# Patient Record
Sex: Male | Born: 1985 | Race: Black or African American | Hispanic: No | Marital: Single | Smoking: Current some day smoker
Health system: Southern US, Community
[De-identification: ages and names within clinical notes are randomized; demographics above are authoritative.]

## PROBLEM LIST (undated history)

## (undated) DIAGNOSIS — I10 Essential (primary) hypertension: Secondary | ICD-10-CM

## (undated) DIAGNOSIS — Z9889 Other specified postprocedural states: Secondary | ICD-10-CM

---

## 2007-08-07 ENCOUNTER — Emergency Department (HOSPITAL_COMMUNITY): Admission: EM | Admit: 2007-08-07 | Discharge: 2007-08-07 | Payer: Self-pay | Admitting: Emergency Medicine

## 2007-11-28 ENCOUNTER — Emergency Department (HOSPITAL_COMMUNITY): Admission: EM | Admit: 2007-11-28 | Discharge: 2007-11-28 | Payer: Self-pay | Admitting: Emergency Medicine

## 2008-02-11 ENCOUNTER — Emergency Department (HOSPITAL_COMMUNITY): Admission: EM | Admit: 2008-02-11 | Discharge: 2008-02-11 | Payer: Self-pay | Admitting: Emergency Medicine

## 2008-05-04 ENCOUNTER — Emergency Department (HOSPITAL_COMMUNITY): Admission: EM | Admit: 2008-05-04 | Discharge: 2008-05-04 | Payer: Self-pay | Admitting: Family Medicine

## 2008-08-13 ENCOUNTER — Emergency Department (HOSPITAL_COMMUNITY): Admission: EM | Admit: 2008-08-13 | Discharge: 2008-08-13 | Payer: Self-pay | Admitting: Family Medicine

## 2008-08-30 ENCOUNTER — Emergency Department (HOSPITAL_COMMUNITY): Admission: EM | Admit: 2008-08-30 | Discharge: 2008-08-30 | Payer: Self-pay | Admitting: Family Medicine

## 2008-12-26 ENCOUNTER — Emergency Department (HOSPITAL_COMMUNITY): Admission: EM | Admit: 2008-12-26 | Discharge: 2008-12-26 | Payer: Self-pay | Admitting: Family Medicine

## 2009-01-20 ENCOUNTER — Emergency Department (HOSPITAL_COMMUNITY): Admission: EM | Admit: 2009-01-20 | Discharge: 2009-01-20 | Payer: Self-pay | Admitting: Emergency Medicine

## 2009-02-23 ENCOUNTER — Emergency Department (HOSPITAL_COMMUNITY): Admission: EM | Admit: 2009-02-23 | Discharge: 2009-02-23 | Payer: Self-pay | Admitting: Emergency Medicine

## 2009-03-07 ENCOUNTER — Emergency Department (HOSPITAL_COMMUNITY): Admission: EM | Admit: 2009-03-07 | Discharge: 2009-03-07 | Payer: Self-pay | Admitting: Family Medicine

## 2009-03-10 ENCOUNTER — Emergency Department (HOSPITAL_COMMUNITY): Admission: EM | Admit: 2009-03-10 | Discharge: 2009-03-10 | Payer: Self-pay | Admitting: Family Medicine

## 2009-03-15 ENCOUNTER — Emergency Department (HOSPITAL_COMMUNITY): Admission: EM | Admit: 2009-03-15 | Discharge: 2009-03-15 | Payer: Self-pay | Admitting: Family Medicine

## 2009-03-26 ENCOUNTER — Emergency Department (HOSPITAL_COMMUNITY): Admission: EM | Admit: 2009-03-26 | Discharge: 2009-03-26 | Payer: Self-pay | Admitting: Family Medicine

## 2009-04-01 ENCOUNTER — Emergency Department (HOSPITAL_COMMUNITY): Admission: EM | Admit: 2009-04-01 | Discharge: 2009-04-01 | Payer: Self-pay | Admitting: Family Medicine

## 2009-04-09 ENCOUNTER — Emergency Department (HOSPITAL_COMMUNITY): Admission: EM | Admit: 2009-04-09 | Discharge: 2009-04-09 | Payer: Self-pay | Admitting: Family Medicine

## 2009-04-25 ENCOUNTER — Emergency Department (HOSPITAL_COMMUNITY): Admission: EM | Admit: 2009-04-25 | Discharge: 2009-04-25 | Payer: Self-pay | Admitting: Emergency Medicine

## 2009-05-02 ENCOUNTER — Emergency Department (HOSPITAL_COMMUNITY): Admission: EM | Admit: 2009-05-02 | Discharge: 2009-05-02 | Payer: Self-pay | Admitting: Family Medicine

## 2009-05-08 ENCOUNTER — Emergency Department (HOSPITAL_COMMUNITY): Admission: EM | Admit: 2009-05-08 | Discharge: 2009-05-08 | Payer: Self-pay | Admitting: Family Medicine

## 2010-03-05 ENCOUNTER — Emergency Department (HOSPITAL_COMMUNITY)
Admission: EM | Admit: 2010-03-05 | Discharge: 2010-03-05 | Payer: Self-pay | Source: Home / Self Care | Admitting: Emergency Medicine

## 2010-05-26 ENCOUNTER — Emergency Department (HOSPITAL_COMMUNITY)
Admission: EM | Admit: 2010-05-26 | Discharge: 2010-05-26 | Payer: Self-pay | Source: Home / Self Care | Admitting: Family Medicine

## 2010-05-29 LAB — HIV ANTIBODY (ROUTINE TESTING W REFLEX): HIV: NONREACTIVE

## 2010-05-29 LAB — SYPHILIS: RPR W/REFLEX TO RPR TITER AND TREPONEMAL ANTIBODIES, TRADITIONAL SCREENING AND DIAGNOSIS ALGORITHM: RPR Ser Ql: NONREACTIVE

## 2010-07-18 LAB — POCT URINALYSIS DIPSTICK
Nitrite: NEGATIVE
Protein, ur: 30 mg/dL — AB
Urobilinogen, UA: 4 mg/dL — ABNORMAL HIGH (ref 0.0–1.0)
pH: 6 (ref 5.0–8.0)

## 2010-07-18 LAB — GC/CHLAMYDIA PROBE AMP, GENITAL: GC Probe Amp, Genital: NEGATIVE

## 2010-08-08 LAB — POCT URINALYSIS DIP (DEVICE)
Protein, ur: NEGATIVE mg/dL
Specific Gravity, Urine: 1.015 (ref 1.005–1.030)
Urobilinogen, UA: 1 mg/dL (ref 0.0–1.0)

## 2010-08-08 LAB — GC/CHLAMYDIA PROBE AMP, GENITAL: GC Probe Amp, Genital: NEGATIVE

## 2010-08-10 ENCOUNTER — Inpatient Hospital Stay (HOSPITAL_COMMUNITY)
Admission: RE | Admit: 2010-08-10 | Discharge: 2010-08-10 | Disposition: A | Discharge status: Home / Self Care | Payer: Self-pay | Source: Ambulatory Visit | Attending: Family Medicine | Admitting: Family Medicine

## 2010-08-10 LAB — RPR: RPR Ser Ql: NONREACTIVE

## 2010-08-11 LAB — HIV ANTIBODY (ROUTINE TESTING W REFLEX): HIV: NONREACTIVE

## 2010-08-13 LAB — GC/CHLAMYDIA PROBE AMP, GENITAL
Chlamydia, DNA Probe: NEGATIVE
GC Probe Amp, Genital: NEGATIVE

## 2010-08-15 LAB — GC/CHLAMYDIA PROBE AMP, GENITAL: GC Probe Amp, Genital: NEGATIVE

## 2011-01-23 ENCOUNTER — Inpatient Hospital Stay (INDEPENDENT_AMBULATORY_CARE_PROVIDER_SITE_OTHER)
Admission: RE | Admit: 2011-01-23 | Discharge: 2011-01-23 | Disposition: A | Discharge status: Home / Self Care | Payer: PRIVATE HEALTH INSURANCE | Source: Ambulatory Visit | Attending: Family Medicine | Admitting: Family Medicine

## 2011-01-23 DIAGNOSIS — L259 Unspecified contact dermatitis, unspecified cause: Secondary | ICD-10-CM

## 2011-01-23 LAB — HIV ANTIBODY (ROUTINE TESTING W REFLEX): HIV: NONREACTIVE

## 2011-02-01 LAB — POCT URINALYSIS DIP (DEVICE)
Ketones, ur: 15 — AB
Operator id: 282151
Protein, ur: >=300 — AB
Specific Gravity, Urine: 1.025

## 2011-02-08 LAB — POCT URINALYSIS DIP (DEVICE)
Bilirubin Urine: NEGATIVE
Glucose, UA: NEGATIVE mg/dL
Protein, ur: 30 mg/dL — AB
Specific Gravity, Urine: 1.025 (ref 1.005–1.030)

## 2011-02-08 LAB — HIV ANTIBODY (ROUTINE TESTING W REFLEX): HIV: NONREACTIVE

## 2011-05-22 ENCOUNTER — Encounter (HOSPITAL_COMMUNITY): Payer: Self-pay | Admitting: Emergency Medicine

## 2011-05-22 ENCOUNTER — Emergency Department (HOSPITAL_COMMUNITY)
Admission: EM | Admit: 2011-05-22 | Discharge: 2011-05-22 | Disposition: A | Discharge status: Home / Self Care | Payer: Self-pay | Attending: Emergency Medicine | Admitting: Emergency Medicine

## 2011-05-22 DIAGNOSIS — J029 Acute pharyngitis, unspecified: Secondary | ICD-10-CM | POA: Insufficient documentation

## 2011-05-22 MED ORDER — AMOXICILLIN 500 MG PO TABS: 1000.0000 mg | ORAL_TABLET | Freq: Two times a day (BID) | ORAL | Status: AC

## 2011-05-22 NOTE — ED Notes (Signed)
Patient states has had sore throat headache, and  Body aches, no nausea, vomiting or diarrhea

## 2011-05-22 NOTE — ED Provider Notes (Signed)
History     CSN: [id number]  Arrival date & time 05/22/11  1537   First MD Initiated Contact with Patient 05/22/11 1631      Chief Complaint  Patient presents with  . Sore Throat    (Consider location/radiation/quality/duration/timing/severity/associated sxs/prior treatment) HPI Jeffrey Johnson is a 26 y.o. male presents with c/o sore throat leading to desire to be assessed in the ED. The sx(s) have been present for 2 days. Additional concerns are headache and stomach pain. Causative factors are not known. Palliative factors are nothing. The distress associated is mild. The disorder has been present for 2 days. History reviewed. No pertinent past medical history.  History reviewed. No pertinent past surgical history.  Family History  Problem Relation Age of Onset  . Diabetes Other   . Diabetes Other     History  Substance Use Topics  . Smoking status: Never Smoker   . Smokeless tobacco: Never Used  . Alcohol Use: No     occausionally      Review of Systems  All other systems reviewed and are negative.    Allergies  Review of patient's allergies indicates no known allergies.  Home Medications   Current Outpatient Rx  Name Route Sig Dispense Refill  . IBUPROFEN 200 MG PO TABS Oral Take 200 mg by mouth every 6 (six) hours as needed. For pain.    Marland Kitchen AMOXICILLIN 500 MG PO TABS Oral Take 2 tablets (1,000 mg total) by mouth 2 (two) times daily. 28 tablet 0    BP 145/76  Pulse 84  Temp(Src) 99.5 F (37.5 C) (Oral)  Resp 16  SpO2 99%  Physical Exam  Nursing note and vitals reviewed. Constitutional: He is oriented to person, place, and time. He appears well-developed and well-nourished.  HENT:  Head: Normocephalic and atraumatic.  Right Ear: External ear normal.  Left Ear: External ear normal.  Mouth/Throat: No oropharyngeal exudate.       Tonsils red and slightly swollen  Eyes: Conjunctivae and EOM are normal. Pupils are equal, round, and reactive to  light.  Neck: Normal range of motion and phonation normal. Neck supple.  Cardiovascular: Normal rate, regular rhythm, normal heart sounds and intact distal pulses.   Pulmonary/Chest: Effort normal and breath sounds normal. He exhibits no bony tenderness.  Abdominal: Soft. Normal appearance. There is no tenderness.  Musculoskeletal: Normal range of motion.  Neurological: He is alert and oriented to person, place, and time. He has normal strength. No cranial nerve deficit or sensory deficit. He exhibits normal muscle tone. Coordination normal.  Skin: Skin is warm, dry and intact.  Psychiatric: He has a normal mood and affect. His behavior is normal. Judgment and thought content normal.    ED Course  Procedures (including critical care time)  Labs Reviewed - No data to display No results found.   1. Pharyngitis       MDM  Clinical pharyngitis, possible strep, pt prefers empiric treatment.        Flint Melter, MD 05/22/11 [phone]

## 2011-05-24 NOTE — ED Notes (Signed)
Pt. came in for his lab results. Pt. verified with picture ID and shown neg. GC/Chlamydia and non- reactive HIV and RPR. Pt. instructed to get HIV rechecked in 2 mos. ( since he had it 4 mos. ago.) He asked where he can check to see if he is able to get his wife pregnant. I told him, they would need to go to her OB-GYN. If they don't specialize in infertility then they could refer her to a fertility specialist. They would instruct him where to got to get sperm count checked. Vassie Moselle 05/24/2011

## 2011-08-05 ENCOUNTER — Emergency Department (HOSPITAL_COMMUNITY)
Admission: EM | Admit: 2011-08-05 | Discharge: 2011-08-05 | Disposition: A | Discharge status: Home / Self Care | Payer: Self-pay | Source: Home / Self Care | Attending: Emergency Medicine | Admitting: Emergency Medicine

## 2011-08-05 ENCOUNTER — Encounter (HOSPITAL_COMMUNITY): Payer: Self-pay | Admitting: *Deleted

## 2011-08-05 DIAGNOSIS — R040 Epistaxis: Secondary | ICD-10-CM

## 2011-08-05 DIAGNOSIS — R030 Elevated blood-pressure reading, without diagnosis of hypertension: Secondary | ICD-10-CM

## 2011-08-05 NOTE — Discharge Instructions (Signed)
As discussed start with normal saline spray rightmost or and to night apply a feeling of Vaseline with a Q-tip carefully. As discussed incidentally your blood pressure was noted to be elevated will need to have your blood pressure recheck, if elevated numbers are seen should return for recheck in referral to a primary care Dr. Gaylyn Lambert discharge instructions about nosebleeds    Arterial Hypertension Arterial hypertension (high blood pressure) is a condition of elevated pressure in your blood vessels. Hypertension over a long period of time is a risk factor for strokes, heart attacks, and heart failure. It is also the leading cause of kidney (renal) failure.  CAUSES   In Adults -- Over 90% of all hypertension has no known cause. This is called essential or primary hypertension. In the other 10% of people with hypertension, the increase in blood pressure is caused by another disorder. This is called secondary hypertension. Important causes of secondary hypertension are:   Heavy alcohol use.   Obstructive sleep apnea.   Hyperaldosterosim (Conn's syndrome).   Steroid use.   Chronic kidney failure.   Hyperparathyroidism.   Medications.   Renal artery stenosis.   Pheochromocytoma.   Cushing's disease.   Coarctation of the aorta.   Scleroderma renal crisis.   Licorice (in excessive amounts).   Drugs (cocaine, methamphetamine).  Your caregiver can explain any items above that apply to you.  In Children -- Secondary hypertension is more common and should always be considered.   Pregnancy -- Few women of childbearing age have high blood pressure. However, up to 10% of them develop hypertension of pregnancy. Generally, this will not harm the woman. It may be a sign of 3 complications of pregnancy: preeclampsia, HELLP syndrome, and eclampsia. Follow up and control with medication is necessary.  SYMPTOMS   This condition normally does not produce any noticeable symptoms. It is usually  found during a routine exam.   Malignant hypertension is a late problem of high blood pressure. It may have the following symptoms:   Headaches.   Blurred vision.   End-organ damage (this means your kidneys, heart, lungs, and other organs are being damaged).   Stressful situations can increase the blood pressure. If a person with normal blood pressure has their blood pressure go up while being seen by their caregiver, this is often termed "white coat hypertension." Its importance is not known. It may be related with eventually developing hypertension or complications of hypertension.   Hypertension is often confused with mental tension, stress, and anxiety.  DIAGNOSIS  The diagnosis is made by 3 separate blood pressure measurements. They are taken at least 1 week apart from each other. If there is organ damage from hypertension, the diagnosis may be made without repeat measurements. Hypertension is usually identified by having blood pressure readings:  Above 140/90 mmHg measured in both arms, at 3 separate times, over a couple weeks.   Over 130/80 mmHg should be considered a risk factor and may require treatment in patients with diabetes.  Blood pressure readings over 120/80 mmHg are called "pre-hypertension" even in non-diabetic patients. To get a true blood pressure measurement, use the following guidelines. Be aware of the factors that can alter blood pressure readings.  Take measurements at least 1 hour after caffeine.   Take measurements 30 minutes after smoking and without any stress. This is another reason to quit smoking - it raises your blood pressure.   Use a proper cuff size. Ask your caregiver if you are not sure  about your cuff size.   Most home blood pressure cuffs are automatic. They will measure systolic and diastolic pressures. The systolic pressure is the pressure reading at the start of sounds. Diastolic pressure is the pressure at which the sounds disappear. If you  are elderly, measure pressures in multiple postures. Try sitting, lying or standing.   Sit at rest for a minimum of 5 minutes before taking measurements.   You should not be on any medications like decongestants. These are found in many cold medications.   Record your blood pressure readings and review them with your caregiver.  If you have hypertension:  Your caregiver may do tests to be sure you do not have secondary hypertension (see "causes" above).   Your caregiver may also look for signs of metabolic syndrome. This is also called Syndrome X or Insulin Resistance Syndrome. You may have this syndrome if you have type 2 diabetes, abdominal obesity, and abnormal blood lipids in addition to hypertension.   Your caregiver will take your medical and family history and perform a physical exam.   Diagnostic tests may include blood tests (for glucose, cholesterol, potassium, and kidney function), a urinalysis, or an EKG. Other tests may also be necessary depending on your condition.  PREVENTION  There are important lifestyle issues that you can adopt to reduce your chance of developing hypertension:  Maintain a normal weight.   Limit the amount of salt (sodium) in your diet.   Exercise often.   Limit alcohol intake.   Get enough potassium in your diet. Discuss specific advice with your caregiver.   Follow a DASH diet (dietary approaches to stop hypertension). This diet is rich in fruits, vegetables, and low-fat dairy products, and avoids certain fats.  PROGNOSIS  Essential hypertension cannot be cured. Lifestyle changes and medical treatment can lower blood pressure and reduce complications. The prognosis of secondary hypertension depends on the underlying cause. Many people whose hypertension is controlled with medicine or lifestyle changes can live a normal, healthy life.  RISKS AND COMPLICATIONS  While high blood pressure alone is not an illness, it often requires treatment due to its  short- and long-term effects on many organs. Hypertension increases your risk for:  CVAs or strokes (cerebrovascular accident).   Heart failure due to chronically high blood pressure (hypertensive cardiomyopathy).   Heart attack (myocardial infarction).   Damage to the retina (hypertensive retinopathy).   Kidney failure (hypertensive nephropathy).  Your caregiver can explain list items above that apply to you. Treatment of hypertension can significantly reduce the risk of complications. TREATMENT   For overweight patients, weight loss and regular exercise are recommended. Physical fitness lowers blood pressure.   Mild hypertension is usually treated with diet and exercise. A diet rich in fruits and vegetables, fat-free dairy products, and foods low in fat and salt (sodium) can help lower blood pressure. Decreasing salt intake decreases blood pressure in a 1/3 of people.   Stop smoking if you are a smoker.  The steps above are highly effective in reducing blood pressure. While these actions are easy to suggest, they are difficult to achieve. Most patients with moderate or severe hypertension end up requiring medications to bring their blood pressure down to a normal level. There are several classes of medications for treatment. Blood pressure pills (antihypertensives) will lower blood pressure by their different actions. Lowering the blood pressure by 10 mmHg may decrease the risk of complications by as much as 25%. The goal of treatment is effective blood  pressure control. This will reduce your risk for complications. Your caregiver will help you determine the best treatment for you according to your lifestyle. What is excellent treatment for one person, may not be for you. HOME CARE INSTRUCTIONS   Do not smoke.   Follow the lifestyle changes outlined in the "Prevention" section.   If you are on medications, follow the directions carefully. Blood pressure medications must be taken as  prescribed. Skipping doses reduces their benefit. It also puts you at risk for problems.   Follow up with your caregiver, as directed.   If you are asked to monitor your blood pressure at home, follow the guidelines in the "Diagnosis" section above.  SEEK MEDICAL CARE IF:   You think you are having medication side effects.   You have recurrent headaches or lightheadedness.   You have swelling in your ankles.   You have trouble with your vision.  SEEK IMMEDIATE MEDICAL CARE IF:   You have sudden onset of chest pain or pressure, difficulty breathing, or other symptoms of a heart attack.   You have a severe headache.   You have symptoms of a stroke (such as sudden weakness, difficulty speaking, difficulty walking).  MAKE SURE YOU:   Understand these instructions.   Will watch your condition.   Will get help right away if you are not doing well or get worse.  Document Released: 04/22/2005 Document Revised: 04/11/2011 Document Reviewed: 11/20/2006 [facility] Patient Information 2012 [place], [place].

## 2011-08-05 NOTE — ED Provider Notes (Signed)
History     CSN: [id number]  Arrival date & time 08/05/11  1842   First MD Initiated Contact with Patient 08/05/11 1900      Chief Complaint  Patient presents with  . Epistaxis    (Consider location/radiation/quality/duration/timing/severity/associated sxs/prior treatment) HPI Comments: Have noticed since Saturday my notes I think helping try and Saturday first episode in which the right side of my nose bled but it stopped after a pinch my nose.. Some they have bled again and blue clot I had in my nose bled even for more after that pinch my nose again and stopped. Today around 5 to 6:00 started bleeding again and put a towel my nose to stop the bleeding. Have been having a bit of a runny and congested nose with some throat irritation but no fevers and no other symptoms. NoI have no other symptoms and I have no knowledge about her blood pressure being high.  Patient is a 26 y.o. male presenting with nosebleeds. The history is provided by the patient.  Epistaxis  This is a new problem. The current episode started 2 days ago. The problem occurs daily. The problem has been resolved. The bleeding has been from the right nare. He has tried nothing for the symptoms. The treatment provided significant relief. His past medical history does not include bleeding disorder, colds, sinus problems, allergies or nose-picking.    History reviewed. No pertinent past medical history.  History reviewed. No pertinent past surgical history.  Family History  Problem Relation Age of Onset  . Diabetes Other   . Diabetes Other     History  Substance Use Topics  . Smoking status: Never Smoker   . Smokeless tobacco: Never Used  . Alcohol Use: Yes     occausionally      Review of Systems  Constitutional: Negative for fever, appetite change and fatigue.  HENT: Positive for nosebleeds, congestion and rhinorrhea. Negative for neck pain.   Eyes: Negative for pain.  Respiratory: Negative for cough and  shortness of breath.   Skin: Negative for color change, pallor and rash.  Psychiatric/Behavioral: The patient is nervous/anxious.     Allergies  Review of patient's allergies indicates no known allergies.  Home Medications   Current Outpatient Rx  Name Route Sig Dispense Refill  . IBUPROFEN 200 MG PO TABS Oral Take 200 mg by mouth every 6 (six) hours as needed. For pain.      BP 161/101  Pulse 66  Temp(Src) 99.2 F (37.3 C) (Oral)  Resp 16  SpO2 100%  Physical Exam  Nursing note and vitals reviewed. Constitutional: He appears well-developed and well-nourished. No distress.  HENT:  Head: Normocephalic.  Nose: Mucosal edema present. No sinus tenderness or septal deviation. Epistaxis is observed.  No foreign bodies.  Eyes: Conjunctivae are normal.  Neck: Neck supple.  Cardiovascular: Normal rate, regular rhythm, S1 normal, normal heart sounds and normal pulses.   Pulmonary/Chest: Effort normal. He has decreased breath sounds.  Abdominal: He exhibits no distension.  Skin: Skin is warm. No erythema.    ED Course  Procedures (including critical care time)  Labs Reviewed - No data to display No results found.   1. Right-sided epistaxis   2. Elevated blood pressure       MDM  Right nostril epistaxis recurrent for 3 days. Resolve during exam no active bleeding noted noted patient to be hypertensive patient denies any history of high blood pressures does admit that he is a patches and nerves  will recheck his blood pressure in the near future will return for recheck.        Jimmie Molly, MD 08/05/11 2021

## 2011-08-05 NOTE — ED Notes (Signed)
pT  REPORTS  HE  NOTICED  A  NOSEBLEED EARLIER  OVER THE  WEEKEND  HE  REPORTS  HE  AWOKE  AFTER A  SLEEP  AND  NOTICED  SOME BLOOD REOM R  NARE  -  NO  ACTIVE  BLEEDING AT THIS  TIME  - HE  DOES  REPORT  RECENT   SORETHROAT      AND  SOME  NASAL CONGESTION

## 2011-08-08 NOTE — ED Notes (Signed)
Pt. came to [facility] requesting a work note. States he returned to work today. Discussed with Dr. Ladon Applebaum and he said that was fine.  Do the work note to return today 4/4. Note done as directed and given to the patient. Jeffrey Johnson 08/08/2011

## 2013-05-23 ENCOUNTER — Emergency Department (INDEPENDENT_AMBULATORY_CARE_PROVIDER_SITE_OTHER)
Admission: EM | Admit: 2013-05-23 | Discharge: 2013-05-23 | Disposition: A | Discharge status: Home / Self Care | Payer: Self-pay | Source: Home / Self Care | Attending: Emergency Medicine | Admitting: Emergency Medicine

## 2013-05-23 ENCOUNTER — Encounter (HOSPITAL_COMMUNITY): Payer: Self-pay | Admitting: Emergency Medicine

## 2013-05-23 DIAGNOSIS — K029 Dental caries, unspecified: Secondary | ICD-10-CM

## 2013-05-23 MED ORDER — NAPROXEN 375 MG PO TABS: 375.0000 mg | ORAL_TABLET | Freq: Two times a day (BID) | ORAL | Status: DC

## 2013-05-23 MED ORDER — LIDOCAINE VISCOUS 2 % MT SOLN
OROMUCOSAL | Status: DC
Start: 2013-05-23 — End: 2019-04-19

## 2013-05-23 NOTE — ED Provider Notes (Signed)
Medical screening examination/treatment/procedure(s) were performed by non-physician practitioner and as supervising physician I was immediately available for consultation/collaboration.  Leslee Homeavid Kwane Rohl, M.D.  Reuben Likesavid C Kunta Hilleary, MD 05/23/13 2129

## 2013-05-23 NOTE — ED Provider Notes (Signed)
CSN: [id number]     Arrival date & time 05/23/13  [id number] History   First MD Initiated Contact with Patient 05/23/13 1048     Chief Complaint  Patient presents with  . Otalgia   (Consider location/radiation/quality/duration/timing/severity/associated sxs/prior Treatment) HPI Comments: Patient reports that about 3 days ago he was eating a meal and felt his filling come out of a left upper molar. Since that time he has had sensitivity at that tooth with some associated left ear pain. Some relief with ibuprofen. No fever, facial swelling, drainage from ear or hearing loss. No sore throat.   Patient is a 28 y.o. male presenting with ear pain. The history is provided by the patient.  Otalgia Associated symptoms: no neck pain     History reviewed. No pertinent past medical history. History reviewed. No pertinent past surgical history. Family History  Problem Relation Age of Onset  . Diabetes Other   . Diabetes Other    History  Substance Use Topics  . Smoking status: Never Smoker   . Smokeless tobacco: Never Used  . Alcohol Use: Yes     Comment: occausionally    Review of Systems  Constitutional: Negative.   HENT: Positive for dental problem and ear pain.   Eyes: Negative.   Cardiovascular: Negative.   Gastrointestinal: Negative.   Musculoskeletal: Negative for neck pain and neck stiffness.  Skin: Negative.   Hematological: Negative for adenopathy.    Allergies  Review of patient's allergies indicates no known allergies.  Home Medications   Current Outpatient Rx  Name  Route  Sig  Dispense  Refill  . ibuprofen (ADVIL,MOTRIN) 200 MG tablet   Oral   Take 200 mg by mouth every 6 (six) hours as needed. For pain.          BP 145/92  Pulse 52  Temp(Src) 97.4 F (36.3 C) (Oral)  Resp 17  SpO2 100% Physical Exam  Nursing note and vitals reviewed. Constitutional: He is oriented to person, place, and time. He appears well-developed and well-nourished. No distress.  HENT:    Head: Normocephalic and atraumatic.  Right Ear: Hearing, tympanic membrane, external ear and ear canal normal. No mastoid tenderness.  Left Ear: Hearing, tympanic membrane, external ear and ear canal normal. No mastoid tenderness.  Nose: Nose normal.  Mouth/Throat: Uvula is midline, oropharynx is clear and moist and mucous membranes are normal.    Eyes: Conjunctivae are normal. Right eye exhibits no discharge. Left eye exhibits no discharge. No scleral icterus.  Neck: Normal range of motion. Neck supple.  Cardiovascular: Normal rate.   Pulmonary/Chest: Effort normal.  Musculoskeletal: Normal range of motion.  Lymphadenopathy:    He has no cervical adenopathy.  Neurological: He is alert and oriented to person, place, and time.  Skin: Skin is warm and dry.  Psychiatric: He has a normal mood and affect. His behavior is normal.    ED Course  Procedures (including critical care time) Labs Review Labs Reviewed - No data to display Imaging Review No results found.  EKG Interpretation    Date/Time:    Ventricular Rate:    PR Interval:    QRS Duration:   QT Interval:    QTC Calculation:   R Axis:     Text Interpretation:              MDM  Left ear and eardrum normal. Likely experiencing referred pain from dental issue. Will assist with pain management and patient reports that  will contact his  dentist on Monday for evaluation.     Jess Barters [place], [place] 05/23/13 [phone]

## 2013-05-23 NOTE — ED Notes (Signed)
Pt     Reports  l  Earache      X  3  Days             No  Other  Symptoms  Except  For   Hole in a  Tooth  On that  Side   -  No  Facial  Swelling

## 2013-05-23 NOTE — Discharge Instructions (Signed)
Dental Caries  °Dental caries (also called tooth decay) is the most common oral disease. It can occur at any age, but is more common in children and young adults.  °HOW DENTAL CARIES DEVELOPS  °The process of decay begins when bacteria and foods (particularly sugars and starches) combine in your mouth to produce plaque. Plaque is a substance that sticks to the hard, outer surface of a tooth (enamel). The bacteria in plaque produce acids that attack enamel. These acids may also attack the root surface of a tooth (cementum) if it is exposed. Repeated attacks dissolve these surfaces and create holes in the tooth (cavities). If left untreated, the acids destroy the other layers of the tooth.  °RISK FACTORS °· Frequent sipping of sugary beverages.   °· Frequent snacking on sugary and starchy foods, especially those that easily get stuck in the teeth.   °· Poor oral hygiene.   °· Dry mouth.   °· Substance abuse such as methamphetamine abuse.   °· Broken or poor-fitting dental restorations.   °· Eating disorders.   °· Gastroesophageal reflux disease (GERD).   °· Certain radiation treatments to the head and neck. °SYMPTOMS °In the early stages of dental caries, symptoms are seldom present. Sometimes white, chalky areas may be seen on the enamel or other tooth layers. In later stages, symptoms may include: °· Pits and holes on the enamel. °· Toothache after sweet, hot, or cold foods or drinks are consumed. °· Pain around the tooth. °· Swelling around the tooth. °DIAGNOSIS  °Most of the time, dental caries is detected during a regular dental checkup. A diagnosis is made after a thorough medical and dental history is taken and the surfaces of your teeth are checked for signs of dental caries. Sometimes special instruments, such as lasers, are used to check for dental caries. Dental X-ray exams may be taken so that areas not visible to the eye (such as between the contact areas of the teeth) can be checked for cavities.    °TREATMENT  °If dental caries is in its early stages, it may be reversed with a fluoride treatment or an application of a remineralizing agent at the dental office. Thorough brushing and flossing at home is needed to aid these treatments. If it is in its later stages, treatment depends on the location and extent of tooth destruction:  °· If a small area of the tooth has been destroyed, the destroyed area will be removed and cavities will be filled with a material such as gold, silver amalgam, or composite resin.   °· If a large area of the tooth has been destroyed, the destroyed area will be removed and a cap (crown) will be fitted over the remaining tooth structure.   °· If the center part of the tooth (pulp) is affected, a procedure called a root canal will be needed before a filling or crown can be placed.   °· If most of the tooth has been destroyed, the tooth may need to be pulled (extracted). °HOME CARE INSTRUCTIONS °You can prevent, stop, or reverse dental caries at home by practicing good oral hygiene. Good oral hygiene includes: °· Thoroughly cleaning your teeth at least twice a day with a toothbrush and dental floss.   °· Using a fluoride toothpaste. A fluoride mouth rinse may also be used if recommended by your dentist or health care provider.   °· Restricting the amount of sugary and starchy foods and sugary liquids you consume.   °· Avoiding frequent snacking on these foods and sipping of these liquids.   °· Keeping regular visits   with a dentist for checkups and cleanings. °PREVENTION  °· Practice good oral hygiene. °· Consider a dental sealant. A dental sealant is a coating material that is applied by your dentist to the pits and grooves of teeth. The sealant prevents food from being trapped in them. It may protect the teeth for several years. °· Ask about fluoride supplements if you live in a community without fluorinated water or with water that has a low fluoride content. Use fluoride supplements  as directed by your dentist or health care provider. °· Allow fluoride varnish applications to teeth if directed by your dentist or health care provider. °Document Released: 01/12/2002 Document Revised: 12/23/2012 Document Reviewed: 04/24/2012 °ExitCare® Patient Information ©2014 [employer]. ° °Dental Pain °A tooth ache may be caused by cavities (tooth decay). Cavities expose the nerve of the tooth to air and hot or cold temperatures. It may come from an infection or abscess (also called a boil or furuncle) around your tooth. It is also often caused by dental caries (tooth decay). This causes the pain you are having. °DIAGNOSIS  °Your caregiver can diagnose this problem by exam. °TREATMENT  °· If caused by an infection, it may be treated with medications which kill germs (antibiotics) and pain medications as prescribed by your caregiver. Take medications as directed. °· Only take over-the-counter or prescription medicines for pain, discomfort, or fever as directed by your caregiver. °· Whether the tooth ache today is caused by infection or dental disease, you should see your dentist as soon as possible for further care. °SEEK MEDICAL CARE IF: °The exam and treatment you received today has been provided on an emergency basis only. This is not a substitute for complete medical or dental care. If your problem worsens or new problems (symptoms) appear, and you are unable to meet with your dentist, call or return to this location. °SEEK IMMEDIATE MEDICAL CARE IF:  °· You have a fever. °· You develop redness and swelling of your face, jaw, or neck. °· You are unable to open your mouth. °· You have severe pain uncontrolled by pain medicine. °MAKE SURE YOU:  °· Understand these instructions. °· Will watch your condition. °· Will get help right away if you are not doing well or get worse. °Document Released: 04/22/2005 Document Revised: 07/15/2011 Document Reviewed: 12/09/2007 °ExitCare® Patient Information ©2014  [employer]. ° °

## 2019-03-21 ENCOUNTER — Other Ambulatory Visit: Payer: Self-pay

## 2019-03-21 ENCOUNTER — Encounter (HOSPITAL_COMMUNITY): Payer: Self-pay | Admitting: Emergency Medicine

## 2019-03-21 ENCOUNTER — Emergency Department (HOSPITAL_COMMUNITY): Payer: Self-pay

## 2019-03-21 ENCOUNTER — Emergency Department (HOSPITAL_COMMUNITY)
Admission: EM | Admit: 2019-03-21 | Discharge: 2019-03-21 | Disposition: A | Discharge status: Home / Self Care | Payer: Self-pay | Attending: Emergency Medicine | Admitting: Emergency Medicine

## 2019-03-21 DIAGNOSIS — Z79899 Other long term (current) drug therapy: Secondary | ICD-10-CM | POA: Insufficient documentation

## 2019-03-21 DIAGNOSIS — R079 Chest pain, unspecified: Secondary | ICD-10-CM

## 2019-03-21 DIAGNOSIS — I10 Essential (primary) hypertension: Secondary | ICD-10-CM

## 2019-03-21 LAB — BASIC METABOLIC PANEL
Anion gap: 8 (ref 5–15)
BUN: 13 mg/dL (ref 6–20)
CO2: 27 mmol/L (ref 22–32)
Calcium: 9.5 mg/dL (ref 8.9–10.3)
Chloride: 105 mmol/L (ref 98–111)
Creatinine, Ser: 1.02 mg/dL (ref 0.61–1.24)
GFR calc Af Amer: >60 mL/min (ref >60)
GFR calc non Af Amer: >60 mL/min (ref >60)
Glucose, Bld: 96 mg/dL (ref 70–99)
Potassium: 3.6 mmol/L (ref 3.5–5.1)
Sodium: 140 mmol/L (ref 135–145)

## 2019-03-21 LAB — CBC
HCT: 37.2 % — ABNORMAL LOW (ref 39.0–52.0)
Hemoglobin: 12.5 g/dL — ABNORMAL LOW (ref 13.0–17.0)
MCH: 29.8 pg (ref 26.0–34.0)
MCHC: 33.6 g/dL (ref 30.0–36.0)
MCV: 88.6 fL (ref 80.0–100.0)
Platelets: 239 10*3/uL (ref 150–400)
RBC: 4.2 MIL/uL — ABNORMAL LOW (ref 4.22–5.81)
RDW: 12.9 % (ref 11.5–15.5)
WBC: 6.2 10*3/uL (ref 4.0–10.5)
nRBC: 0 % (ref 0.0–0.2)

## 2019-03-21 LAB — LIPASE, BLOOD: Lipase: 26 U/L (ref 11–51)

## 2019-03-21 LAB — TROPONIN I (HIGH SENSITIVITY): Troponin I (High Sensitivity): 3 ng/L (ref <18)

## 2019-03-21 MED ORDER — METHOCARBAMOL 500 MG PO TABS: 500.0000 mg | ORAL_TABLET | Freq: Two times a day (BID) | ORAL | 0 refills | Status: DC

## 2019-03-21 MED ORDER — SODIUM CHLORIDE 0.9% FLUSH: 3.0000 mL | Freq: Once | INTRAVENOUS | Status: DC

## 2019-03-21 NOTE — ED Notes (Signed)
Patient brady down 48-49

## 2019-03-21 NOTE — Discharge Instructions (Addendum)
Use Tylenol and muscle relaxer for pain.  Decreased dose of ibuprofen and frequency of use as I am concerned there is a possibility that he may have a ulcer causing your symptoms today.  Follow-up with your primary care provider if you do not have a primary care provider you may use the attached information for [place] and community wellness  Please follow-up with your primary care provider to have your blood pressure rechecked and for continued medication refill.

## 2019-03-21 NOTE — ED Triage Notes (Signed)
Patient here from home with complaints of left sided chest pain. Denies n/v.

## 2019-03-21 NOTE — ED Notes (Signed)
Urinal placed at bedside.

## 2019-03-21 NOTE — ED Provider Notes (Signed)
[facility]-EMERGENCY DEPT Provider Note   CSN: [id number] Arrival date & time: 03/21/19  1315     History   Chief Complaint Chief Complaint  Patient presents with  . Chest Pain    HPI Jeffrey Johnson is a 33 y.o. male history of hypertension on lisinopril  HPI: A 33 year old patient with a history of hypertension presents for evaluation of chest pain. Initial onset of pain was more than 6 hours ago. The patient's chest pain is well-localized, is sharp and is not worse with exertion. The patient's chest pain is middle- or left-sided, is not described as heaviness/pressure/tightness and does not radiate to the arms/jaw/neck. The patient does not complain of nausea and denies diaphoresis. The patient has no history of stroke, has no history of peripheral artery disease, has not smoked in the past 90 days, denies any history of treated diabetes, has no relevant family history of coronary artery disease (first degree relative at less than age 75), has no history of hypercholesterolemia and does not have an elevated BMI (>=30).   HPI  Patient presents today for 4 days of persistent, nagging, mild, sharp left-sided substernal chest pain that is similar to episodes he has had over the past 4 years patient states that these episodes usually last several days to a week.  Patient states that there are no aggravating or mitigating factors.   Unchanged by position or eating.  States he has taken some ibuprofen for this which does not help.  Patient states the pain is nonexertional, not associated with shortness of breath, no nausea, abdominal pain, diaphoresis.  Patient states that 4 years ago he was playing basketball got in the chest and was medically evaluated and had a heart catheterization due to abnormal EKG.  Patient states that that catheterization showed absolutely no problems or occlusion or atherosclerotic disease.  Was told that he was medically cleared.  Since that time  patient states he has been seen and had EKGs and blood work done multiple times for this pain with no further authorizations.  She states that he is never been a smoker, does not use cocaine or any other drugs, drinks occasional alcohol 1-2 drinks per week, no history of clots, no leg swelling, no recent surgery, denies long travel or immobilization.  History reviewed. No pertinent past medical history.  There are no active problems to display for this patient.   History reviewed. No pertinent surgical history.      Home Medications    Prior to Admission medications   Medication Sig Start Date End Date Taking? Authorizing Provider  ibuprofen (ADVIL,MOTRIN) 200 MG tablet Take 200 mg by mouth every 6 (six) hours as needed. For pain.   Yes [provider]  lidocaine (XYLOCAINE) 2 % solution Apply to affected area using a cotton swab every 3 hours as needed for pain. Patient not taking: Reported on 03/21/2019 05/23/13   Ria Clock, PA  lisinopril (ZESTRIL) 20 MG tablet Take 20 mg by mouth daily.    [provider]  methocarbamol (ROBAXIN) 500 MG tablet Take 1 tablet (500 mg total) by mouth 2 (two) times daily. 03/21/19   Gailen Shelter, PA  naproxen (NAPROSYN) 375 MG tablet Take 1 tablet (375 mg total) by mouth 2 (two) times daily. Patient not taking: Reported on 03/21/2019 05/23/13   Presson, Mathis Fare, PA    Family History Family History  Problem Relation Age of Onset  . Diabetes Other   .  Diabetes Other     Social History Social History   Tobacco Use  . Smoking status: Never Smoker  . Smokeless tobacco: Never Used  Substance Use Topics  . Alcohol use: Yes    Comment: occausionally  . Drug use: Yes    Types: Marijuana     Allergies   Patient has no known allergies.   Review of Systems Review of Systems  Constitutional: Negative for chills and fever.  HENT: Negative for congestion.   Respiratory: Negative for cough and shortness  of breath.   Cardiovascular: Positive for chest pain.  Musculoskeletal: Negative for back pain.  Skin: Negative for rash.  All other systems reviewed and are negative.    Physical Exam Updated Vital Signs BP (!) 144/90 (BP Location: Right Arm)   Pulse (!) 49   Temp 98.9 F (37.2 C) (Oral)   Resp 20   SpO2 99%   Physical Exam Vitals signs and nursing note reviewed.  Constitutional:      General: He is not in acute distress.    Comments: Patient is well-appearing, athletic, pleasant 33 year-old male appears stated age  HENT:     Head: Normocephalic and atraumatic.     Nose: Nose normal.  Eyes:     General: No scleral icterus. Neck:     Musculoskeletal: Normal range of motion.  Cardiovascular:     Rate and Rhythm: Normal rate and regular rhythm.     Pulses: Normal pulses.     Heart sounds: Normal heart sounds.  Pulmonary:     Effort: Pulmonary effort is normal. No respiratory distress.     Breath sounds: No wheezing.  Abdominal:     Palpations: Abdomen is soft.     Tenderness: There is no abdominal tenderness.     Comments: Abdominal tenderness to palpation, no guarding, no rebound  Musculoskeletal:     Right lower leg: No edema.     Left lower leg: No edema.     Comments: Reproducible left-sided chest wall tenderness just below the pectoralis muscle.  Skin:    General: Skin is warm and dry.     Capillary Refill: Capillary refill takes less than 2 seconds.  Neurological:     Mental Status: He is alert. Mental status is at baseline.  Psychiatric:        Mood and Affect: Mood normal.        Behavior: Behavior normal.      ED Treatments / Results  Labs (all labs ordered are listed, but only abnormal results are displayed) Labs Reviewed  CBC - Abnormal; Notable for the following components:      Result Value   RBC 4.20 (*)    Hemoglobin 12.5 (*)    HCT 37.2 (*)    All other components within normal limits  BASIC METABOLIC PANEL  LIPASE, BLOOD  RAPID URINE  DRUG SCREEN, HOSP PERFORMED  TROPONIN I (HIGH SENSITIVITY)    EKG EKG Interpretation  Date/Time:  Sunday March 21 2019 13:24:57 EST Ventricular Rate:  61 PR Interval:    QRS Duration: 86 QT Interval:  385 QTC Calculation: 388 R Axis:   86 Text Interpretation: Sinus rhythm LVH by voltage ST elevation suggests acute pericarditis Confirmed by Kristine RoyalMessick, Peter [phone]) on 03/21/2019 1:40:19 PM   Radiology Dg Chest 2 View  Result Date: 03/21/2019 CLINICAL DATA:  Chest pain EXAM: CHEST - 2 VIEW COMPARISON:  None. FINDINGS: The heart size and mediastinal contours are within normal limits. Both lungs are clear.  The visualized skeletal structures are unremarkable. IMPRESSION: No acute abnormality of the lungs. Electronically Signed   By: Eddie Candle M.D.   On: 03/21/2019 15:12    Procedures Procedures (including critical care time)  Medications Ordered in ED Medications  sodium chloride flush (NS) 0.9 % injection 3 mL (0 mLs Intravenous Hold 03/21/19 1351)     Initial Impression / Assessment and Plan / ED Course  I have reviewed the triage vital signs and the nursing notes.  Pertinent labs & imaging results that were available during my care of the patient were reviewed by me and considered in my medical decision making (see chart for details).  Clinical Course as of Mar 20 1602  Sun Mar 21, 2019  1517 Troponin I (High Sensitivity) [WF]  1532 BUN: 13 [WF]  1532 BMP within normal limits  Basic metabolic panel [WF]  1914 Troponin within normal limits  Troponin I (High Sensitivity) [WF]  1533 Chest x-ray normal  DG Chest 2 View [WF]  1533 EKG normal variant  ED EKG [WF]    Clinical Course User Index [WF] Tedd Sias, [place]    HEAR Score: 2  Patient is healthy, well-appearing 33 year old male with a history of controlled hypertension on lisinopril.  No other risk factors for cardiac disease, patient has PERC negative doubt PE.  Heart score 2 troponin is 3.  Patient  has normal chest x-ray without pneumothorax, infiltration or other concerning finding.     Patient is well-appearing doubt pneumonia.  Doubt pericarditis patient has no recent viral illness and does not have pleuritic chest pain.  EKG personally reviewed by myself shows variable pattern.  Does not have clinical history consistent with pericarditis there is no PR depression or QRS widening evident.  Suspect chest pain is musculoskeletal in nature differential does include peptic ulcer disease as patient has been taking ibuprofen frequently.  Commend patient decrease use of ibuprofen and use Tylenol for pain.  Also recommended muscle relaxer which I prescribed and warm compresses with warm heat and stretching.  Vitals within normal limits other than mild bradycardia which is likely due to patient being extremely fit, also with mild hypertension which is unremarkable.  Patient likely hypertensive due to being in the ED.   Final Clinical Impressions(s) / ED Diagnoses   Final diagnoses:  Nonspecific chest pain  Essential hypertension    ED Discharge Orders         Ordered    methocarbamol (ROBAXIN) 500 MG tablet  2 times daily     03/21/19 1534           Pati [facility] [place], [place] 03/21/19 1613    Valarie Merino, MD 03/21/19 2125

## 2019-03-21 NOTE — ED Notes (Signed)
Talked to [place], [place] while in room with patient. Will hold off on labs unless they are indicated.

## 2019-03-21 NOTE — ED Notes (Signed)
Patient aware we need urine. Urinal at bedside 

## 2019-04-19 ENCOUNTER — Other Ambulatory Visit: Payer: Self-pay

## 2019-04-19 ENCOUNTER — Encounter (HOSPITAL_COMMUNITY): Payer: Self-pay | Admitting: Emergency Medicine

## 2019-04-19 ENCOUNTER — Ambulatory Visit (HOSPITAL_COMMUNITY)
Admission: EM | Admit: 2019-04-19 | Discharge: 2019-04-19 | Disposition: A | Discharge status: Home / Self Care | Payer: HRSA Program | Attending: Family Medicine | Admitting: Family Medicine

## 2019-04-19 DIAGNOSIS — Z20828 Contact with and (suspected) exposure to other viral communicable diseases: Secondary | ICD-10-CM | POA: Insufficient documentation

## 2019-04-19 DIAGNOSIS — Z20822 Contact with and (suspected) exposure to covid-19: Secondary | ICD-10-CM

## 2019-04-19 HISTORY — DX: Other specified postprocedural states: Z98.890

## 2019-04-19 NOTE — ED Provider Notes (Signed)
[facility]    CSN: [id number] Arrival date & time: 04/19/19  1246      History   Chief Complaint Chief Complaint  Patient presents with  . Labs Only    HPI Jeffrey Johnson is a 33 y.o. male.   HPI  Patient is here for coronavirus test. He lives with his grandmother learned that she is coronavirus positive. He has no symptoms  Past Medical History:  Diagnosis Date  . History of combined right and left heart catheterization     There are no problems to display for this patient.   History reviewed. No pertinent surgical history.     Home Medications    Prior to Admission medications   Medication Sig Start Date End Date Taking? Authorizing Provider  ibuprofen (ADVIL,MOTRIN) 200 MG tablet Take 200 mg by mouth every 6 (six) hours as needed. For pain.    [provider]  lisinopril (ZESTRIL) 20 MG tablet Take 20 mg by mouth daily.  04/19/19  [provider]    Family History Family History  Problem Relation Age of Onset  . Diabetes Other   . Diabetes Other     Social History Social History   Tobacco Use  . Smoking status: Current Every Day Smoker  . Smokeless tobacco: Never Used  Substance Use Topics  . Alcohol use: Yes    Comment: occausionally  . Drug use: Not Currently    Types: Marijuana     Allergies   Patient has no known allergies.   Review of Systems Review of Systems  Constitutional: Negative for chills and fever.  HENT: Negative for congestion and hearing loss.   Eyes: Negative for pain.  Respiratory: Negative for cough and shortness of breath.   Cardiovascular: Negative for chest pain and leg swelling.  Gastrointestinal: Negative for abdominal pain, constipation and diarrhea.  Genitourinary: Negative for dysuria and frequency.  Musculoskeletal: Negative for myalgias.  Neurological: Negative for dizziness, seizures and headaches.  Psychiatric/Behavioral: The patient is not nervous/anxious.       Physical Exam Triage Vital Signs ED Triage Vitals  Enc Vitals Group     BP 04/19/19 1316 (!) 150/83     Pulse Rate 04/19/19 1316 (!) 105     Resp 04/19/19 1316 (!) 22     Temp 04/19/19 1316 99.5 F (37.5 C)     Temp Source 04/19/19 1316 Oral     SpO2 04/19/19 1316 98 %     Weight       Height       Head Circumference       Peak Flow       Pain Score       Pain Loc       Pain Edu?       Excl. in [place]?     No data found.  Updated Vital Signs BP (!) 150/83 (BP Location: Left Arm)   Pulse (!) 105   Temp 99.5 F (37.5 C) (Oral)   Resp (!) 22   SpO2 98%      Physical Exam Constitutional:      General: He is not in acute distress.    Appearance: He is well-developed.     Comments: Appears well.  Normal conversation.  Wearing mask  HENT:     Head: Normocephalic and atraumatic.  Eyes:     Conjunctiva/sclera: Conjunctivae normal.     Pupils: Pupils are equal, round, and reactive to light.  Cardiovascular:  Rate and Rhythm: Normal rate and regular rhythm.     Heart sounds: Normal heart sounds.  Pulmonary:     Effort: Pulmonary effort is normal. No respiratory distress.     Breath sounds: Normal breath sounds.  Abdominal:     General: There is no distension.     Palpations: Abdomen is soft.  Musculoskeletal:        General: Normal range of motion.     Cervical back: Normal range of motion.  Skin:    General: Skin is warm and dry.  Neurological:     Mental Status: He is alert.      UC Treatments / Results  Labs (all labs ordered are listed, but only abnormal results are displayed) Labs Reviewed  NOVEL CORONAVIRUS, NAA (HOSP ORDER, SEND-OUT TO REF LAB; TAT 18-24 HRS)    EKG   Radiology No results found.  Procedures Procedures (including critical care time)  Medications Ordered in UC Medications - No data to display  Initial Impression / Assessment and Plan / UC Course  I have reviewed the triage vital signs and the nursing  notes.  Pertinent labs & imaging results that were available during my care of the patient were reviewed by me and considered in my medical decision making (see chart for details).     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  He should monitor himself for symptoms. Final Clinical Impressions(s) / UC Diagnoses   Final diagnoses:  Close exposure to COVID-19 virus     Discharge Instructions     Quarantine at home per your company policy. Your test result will be available on My Chart. Be careful with masks and hand washing   ED Prescriptions    None     PDMP not reviewed this encounter.   Eustace Moore, MD 04/19/19 1409

## 2019-04-19 NOTE — ED Notes (Signed)
Nasal swab for covid, labeled and placed in lab

## 2019-04-19 NOTE — ED Triage Notes (Signed)
Patient lives with grandmother and she has tested positive.  Patient thinks patient was tested Friday.  Patient denies any symptoms.

## 2019-04-19 NOTE — Discharge Instructions (Addendum)
Quarantine at home per your company policy. Your test result will be available on My Chart. Be careful with masks and hand washing

## 2019-04-21 LAB — NOVEL CORONAVIRUS, NAA (HOSP ORDER, SEND-OUT TO REF LAB; TAT 18-24 HRS): SARS-CoV-2, NAA: NOT DETECTED

## 2019-05-26 ENCOUNTER — Other Ambulatory Visit: Payer: Self-pay

## 2019-05-26 ENCOUNTER — Encounter (HOSPITAL_COMMUNITY): Payer: Self-pay

## 2019-05-26 ENCOUNTER — Ambulatory Visit (HOSPITAL_COMMUNITY)
Admission: EM | Admit: 2019-05-26 | Discharge: 2019-05-26 | Disposition: A | Discharge status: Home / Self Care | Payer: Self-pay | Attending: Family Medicine | Admitting: Family Medicine

## 2019-05-26 DIAGNOSIS — M545 Low back pain, unspecified: Secondary | ICD-10-CM

## 2019-05-26 DIAGNOSIS — I10 Essential (primary) hypertension: Secondary | ICD-10-CM

## 2019-05-26 DIAGNOSIS — Z760 Encounter for issue of repeat prescription: Secondary | ICD-10-CM

## 2019-05-26 LAB — POCT URINALYSIS DIP (DEVICE)
Bilirubin Urine: NEGATIVE
Glucose, UA: NEGATIVE mg/dL
Hgb urine dipstick: NEGATIVE
Ketones, ur: NEGATIVE mg/dL
Leukocytes,Ua: NEGATIVE
Nitrite: NEGATIVE
Protein, ur: NEGATIVE mg/dL
Specific Gravity, Urine: 1.02 (ref 1.005–1.030)
Urobilinogen, UA: 1 mg/dL (ref 0.0–1.0)
pH: 8.5 — ABNORMAL HIGH (ref 5.0–8.0)

## 2019-05-26 MED ORDER — NAPROXEN 500 MG PO TABS: 500.0000 mg | ORAL_TABLET | Freq: Two times a day (BID) | ORAL | 0 refills | Status: DC

## 2019-05-26 MED ORDER — CYCLOBENZAPRINE HCL 5 MG PO TABS: 5.0000 mg | ORAL_TABLET | Freq: Every day | ORAL | 0 refills | Status: DC

## 2019-05-26 MED ORDER — LISINOPRIL 20 MG PO TABS: 20.0000 mg | ORAL_TABLET | Freq: Every day | ORAL | 0 refills | Status: DC

## 2019-05-26 NOTE — ED Provider Notes (Signed)
Jeffrey Johnson    CSN: [id number] Arrival date & time: 05/26/19  1433      History   Chief Complaint Chief Complaint  Patient presents with  . Back Pain  . Medication Refill    HPI Jeffrey Johnson is a 34 y.o. male.   Jeffrey Johnson presents with complaints of mid/low back pain. Started three days ago. Worse with walking/movement. No specific known injury or trigger, but he does do a lot of standing at his work. No urinary symptoms. No fevers. Denies any previous similar, maybe had some back pain back in high school. Took tylenol which didn't help with pain. No radiation of pain. No numbness tingling or weakness.   Also requesting refill of his lisinopril. He has been out for the past two months and doesn't have a PCP. Was taking 20mg  daily.    ROS per HPI, negative if not otherwise mentioned.      Past Medical History:  Diagnosis Date  . History of combined right and left heart catheterization     There are no problems to display for this patient.   History reviewed. No pertinent surgical history.     Home Medications    Prior to Admission medications   Medication Sig Start Date End Date Taking? Authorizing Provider  cyclobenzaprine (FLEXERIL) 5 MG tablet Take 1 tablet (5 mg total) by mouth at bedtime. 05/26/19   Zigmund Gottron, NP  ibuprofen (ADVIL,MOTRIN) 200 MG tablet Take 200 mg by mouth every 6 (six) hours as needed. For pain.    [provider]  lisinopril (ZESTRIL) 20 MG tablet Take 1 tablet (20 mg total) by mouth daily. 05/26/19   Zigmund Gottron, NP  naproxen (NAPROSYN) 500 MG tablet Take 1 tablet (500 mg total) by mouth 2 (two) times daily. 05/26/19   Zigmund Gottron, NP    Family History Family History  Problem Relation Age of Onset  . Diabetes Other   . Diabetes Other     Social History Social History   Tobacco Use  . Smoking status: Current Every Day Smoker  . Smokeless tobacco: Never Used  Substance Use Topics    . Alcohol use: Yes    Comment: occausionally  . Drug use: Not Currently    Types: Marijuana     Allergies   Patient has no known allergies.   Review of Systems Review of Systems   Physical Exam Triage Vital Signs ED Triage Vitals  Enc Vitals Group     BP 05/26/19 1453 (!) 150/88     Pulse Rate 05/26/19 1453 78     Resp 05/26/19 1453 18     Temp 05/26/19 1453 99.2 F (37.3 C)     Temp Source 05/26/19 1453 Oral     SpO2 05/26/19 1453 99 %     Weight --      Height --      Head Circumference --      Peak Flow --      Pain Score 05/26/19 1452 7     Pain Loc --      Pain Edu? --      Excl. in [place]? --    No data found.  Updated Vital Signs BP (!) 150/88 (BP Location: Left Arm)   Pulse 78   Temp 99.2 F (37.3 C) (Oral)   Resp 18   SpO2 99%    Physical Exam Constitutional:      Appearance: He is well-developed.  Cardiovascular:  Rate and Rhythm: Normal rate.  Pulmonary:     Effort: Pulmonary effort is normal.  Musculoskeletal:     Lumbar back: Tenderness present. No bony tenderness. Normal range of motion. Negative right straight leg raise test and negative left straight leg raise test.       Back:     Comments: strength equal bilaterally; gross sensation intact to lower extremities   Skin:    General: Skin is warm and dry.  Neurological:     Mental Status: He is alert and oriented to person, place, and time.      UC Treatments / Results  Labs (all labs ordered are listed, but only abnormal results are displayed) Labs Reviewed  POCT URINALYSIS DIP (DEVICE) - Abnormal; Notable for the following components:      Result Value   pH 8.5 (*)    All other components within normal limits    EKG   Radiology No results found.  Procedures Procedures (including critical care time)  Medications Ordered in UC Medications - No data to display  Initial Impression / Assessment and Plan / UC Course  I have reviewed the triage vital signs and the  nursing notes.  Pertinent labs & imaging results that were available during my care of the patient were reviewed by me and considered in my medical decision making (see chart for details).     Mid to low back pain on palpation; no other red flag findings on exam. Ambulatory without difficulty. No change in range of motion. ua without acute findings. Lisinopril refilled x30 days with encouragement to establish with a PCP for followup. Return precautions provided. Patient verbalized understanding and agreeable to plan.  Ambulatory out of clinic without difficulty.    Final Clinical Impressions(s) / UC Diagnoses   Final diagnoses:  Acute bilateral low back pain without sciatica  Medication refill  Essential hypertension     Discharge Instructions     Light and regular activity as tolerated.  See exercises provided.  Heat application while active can help with muscle spasms.  Sleep with pillow under your knees.   Naproxen twice a day, take with food. Don't take additional ibuprofen. Flexeril at night as needed as a muscle relaxer. May cause drowsiness. Please do not take if driving or drinking alcohol.   I have provided 1 month of refill of lisinopril.  Please establish with a primary care provider for long term management of your blood pressure as we do not management these medications regularly.     ED Prescriptions    Medication Sig Dispense Auth. Provider   lisinopril (ZESTRIL) 20 MG tablet Take 1 tablet (20 mg total) by mouth daily. 30 tablet Linus Mako B, NP   naproxen (NAPROSYN) 500 MG tablet Take 1 tablet (500 mg total) by mouth 2 (two) times daily. 30 tablet Linus Mako B, NP   cyclobenzaprine (FLEXERIL) 5 MG tablet Take 1 tablet (5 mg total) by mouth at bedtime. 15 tablet Georgetta Haber, NP     PDMP not reviewed this encounter.   Georgetta Haber, NP 05/26/19 1535

## 2019-05-26 NOTE — Discharge Instructions (Signed)
Light and regular activity as tolerated.  See exercises provided.  Heat application while active can help with muscle spasms.  Sleep with pillow under your knees.   Naproxen twice a day, take with food. Don't take additional ibuprofen. Flexeril at night as needed as a muscle relaxer. May cause drowsiness. Please do not take if driving or drinking alcohol.   I have provided 1 month of refill of lisinopril.  Please establish with a primary care provider for long term management of your blood pressure as we do not management these medications regularly.
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
## 2019-07-12 ENCOUNTER — Encounter (HOSPITAL_COMMUNITY): Payer: Self-pay | Admitting: Emergency Medicine

## 2019-07-12 ENCOUNTER — Ambulatory Visit (HOSPITAL_COMMUNITY)
Admission: EM | Admit: 2019-07-12 | Discharge: 2019-07-12 | Disposition: A | Discharge status: Home / Self Care | Payer: Self-pay | Attending: Family Medicine | Admitting: Family Medicine

## 2019-07-12 ENCOUNTER — Other Ambulatory Visit: Payer: Self-pay

## 2019-07-12 DIAGNOSIS — I10 Essential (primary) hypertension: Secondary | ICD-10-CM

## 2019-07-12 DIAGNOSIS — N644 Mastodynia: Secondary | ICD-10-CM | POA: Insufficient documentation

## 2019-07-12 DIAGNOSIS — Z113 Encounter for screening for infections with a predominantly sexual mode of transmission: Secondary | ICD-10-CM | POA: Insufficient documentation

## 2019-07-12 HISTORY — DX: Essential (primary) hypertension: I10

## 2019-07-12 NOTE — ED Triage Notes (Signed)
Pt here for cyst to left breast area x months that he wants checked; pt also requesting STD testing; denies sx

## 2019-07-12 NOTE — Discharge Instructions (Signed)
Checking you for STDs.  We will call you with any positive results.  I will send you for an ultrasound of the breast.  They should be calling to schedule an appointment.

## 2019-07-12 NOTE — ED Provider Notes (Signed)
[facility]    CSN: [id number] Arrival date & time: 07/12/19  0859      History   Chief Complaint Chief Complaint  Patient presents with  . Cyst  . Exposure to STD    HPI DIETER HANE is a 34 y.o. male.   Patient is a 34 year old male who presents today for STD screening.  Denies any symptoms.  He also has left breast "cyst" and mild swelling to the left breast.  Tenderness to palpation.  This is been ongoing issue for him for years.  Denies any fever, drainage, erythema.  ROS per HPI    Exposure to STD    Past Medical History:  Diagnosis Date  . History of combined right and left heart catheterization   . Hypertension     There are no problems to display for this patient.   History reviewed. No pertinent surgical history.     Home Medications    Prior to Admission medications   Medication Sig Start Date End Date Taking? Authorizing Provider  ibuprofen (ADVIL,MOTRIN) 200 MG tablet Take 200 mg by mouth every 6 (six) hours as needed. For pain.    [provider]  lisinopril (ZESTRIL) 20 MG tablet Take 1 tablet (20 mg total) by mouth daily. 05/26/19   Georgetta Haber, NP    Family History Family History  Problem Relation Age of Onset  . Diabetes Other   . Diabetes Other   . Diabetes Mother   . Diabetes Father     Social History Social History   Tobacco Use  . Smoking status: Current Every Day Smoker  . Smokeless tobacco: Never Used  Substance Use Topics  . Alcohol use: Yes    Comment: occausionally  . Drug use: Not Currently    Types: Marijuana     Allergies   Patient has no known allergies.   Review of Systems Review of Systems   Physical Exam Triage Vital Signs ED Triage Vitals  Enc Vitals Group     BP 07/12/19 0930 (!) 145/94     Pulse Rate 07/12/19 0930 74     Resp 07/12/19 0930 18     Temp 07/12/19 0930 98.4 F (36.9 C)     Temp Source 07/12/19 0930 Oral     SpO2 07/12/19 0930 100 %     Weight --        Height --      Head Circumference --      Peak Flow --      Pain Score 07/12/19 0931 0     Pain Loc --      Pain Edu? --      Excl. in [place]? --    No data found.  Updated Vital Signs BP (!) 145/94 (BP Location: Right Arm)   Pulse 74   Temp 98.4 F (36.9 C) (Oral)   Resp 18   SpO2 100%   Visual Acuity Right Eye Distance:   Left Eye Distance:   Bilateral Distance:    Right Eye Near:   Left Eye Near:    Bilateral Near:     Physical Exam Vitals and nursing note reviewed.  Constitutional:      Appearance: Normal appearance.  HENT:     Head: Normocephalic and atraumatic.     Nose: Nose normal.  Eyes:     Conjunctiva/sclera: Conjunctivae normal.  Pulmonary:     Effort: Pulmonary effort is normal.  Chest:       Comments:  No palpable mass Area identified is where patient reports his discomfort is when he palpates. Mild swelling to left breast compared to the right.  No nipple deformity or discharge.  No erythema Musculoskeletal:        General: Normal range of motion.     Cervical back: Normal range of motion.  Skin:    General: Skin is warm and dry.  Neurological:     Mental Status: He is alert.  Psychiatric:        Mood and Affect: Mood normal.      UC Treatments / Results  Labs (all labs ordered are listed, but only abnormal results are displayed) Labs Reviewed  CYTOLOGY, (ORAL, ANAL, URETHRAL) ANCILLARY ONLY    EKG   Radiology No results found.  Procedures Procedures (including critical care time)  Medications Ordered in UC Medications - No data to display  Initial Impression / Assessment and Plan / UC Course  I have reviewed the triage vital signs and the nursing notes.  Pertinent labs & imaging results that were available during my care of the patient were reviewed by me and considered in my medical decision making (see chart for details).     Breast pain-sending for ultrasound of the breast.  Not convinced of any infection or  cancerous process at this time. No obvious palpable mass on exam  STD screening-swab sent for testing.  Patient asymptomatic Final Clinical Impressions(s) / UC Diagnoses   Final diagnoses:  Screening examination for STD (sexually transmitted disease)  Breast pain     Discharge Instructions     Checking you for STDs.  We will call you with any positive results.  I will send you for an ultrasound of the breast.  They should be calling to schedule an appointment.    ED Prescriptions    None     PDMP not reviewed this encounter.   Loura Halt A, NP 07/12/19 [phone]

## 2019-07-13 LAB — CYTOLOGY, (ORAL, ANAL, URETHRAL) ANCILLARY ONLY
Chlamydia: NEGATIVE
Neisseria Gonorrhea: NEGATIVE
Trichomonas: NEGATIVE

## 2019-07-15 ENCOUNTER — Other Ambulatory Visit: Payer: Self-pay | Admitting: Family Medicine

## 2019-07-15 DIAGNOSIS — N6002 Solitary cyst of left breast: Secondary | ICD-10-CM

## 2019-07-30 ENCOUNTER — Other Ambulatory Visit: Payer: Self-pay

## 2019-07-30 ENCOUNTER — Ambulatory Visit
Admission: RE | Admit: 2019-07-30 | Discharge: 2019-07-30 | Disposition: A | Discharge status: Home / Self Care | Payer: Self-pay | Source: Ambulatory Visit | Attending: Family Medicine | Admitting: Family Medicine

## 2019-07-30 DIAGNOSIS — N6002 Solitary cyst of left breast: Secondary | ICD-10-CM

## 2019-08-02 ENCOUNTER — Other Ambulatory Visit: Payer: Self-pay

## 2019-08-02 ENCOUNTER — Emergency Department (HOSPITAL_COMMUNITY)
Admission: EM | Admit: 2019-08-02 | Discharge: 2019-08-02 | Disposition: A | Discharge status: Home / Self Care | Payer: No Typology Code available for payment source | Attending: Emergency Medicine | Admitting: Emergency Medicine

## 2019-08-02 ENCOUNTER — Encounter (HOSPITAL_COMMUNITY): Payer: Self-pay

## 2019-08-02 ENCOUNTER — Emergency Department (HOSPITAL_COMMUNITY): Payer: No Typology Code available for payment source

## 2019-08-02 DIAGNOSIS — R519 Headache, unspecified: Secondary | ICD-10-CM | POA: Diagnosis not present

## 2019-08-02 DIAGNOSIS — I10 Essential (primary) hypertension: Secondary | ICD-10-CM | POA: Diagnosis not present

## 2019-08-02 DIAGNOSIS — Y999 Unspecified external cause status: Secondary | ICD-10-CM | POA: Insufficient documentation

## 2019-08-02 DIAGNOSIS — F17200 Nicotine dependence, unspecified, uncomplicated: Secondary | ICD-10-CM | POA: Diagnosis not present

## 2019-08-02 DIAGNOSIS — R42 Dizziness and giddiness: Secondary | ICD-10-CM | POA: Insufficient documentation

## 2019-08-02 DIAGNOSIS — S0990XA Unspecified injury of head, initial encounter: Secondary | ICD-10-CM | POA: Insufficient documentation

## 2019-08-02 DIAGNOSIS — Z79899 Other long term (current) drug therapy: Secondary | ICD-10-CM | POA: Diagnosis not present

## 2019-08-02 DIAGNOSIS — Y9389 Activity, other specified: Secondary | ICD-10-CM | POA: Diagnosis not present

## 2019-08-02 DIAGNOSIS — Y9289 Other specified places as the place of occurrence of the external cause: Secondary | ICD-10-CM | POA: Insufficient documentation

## 2019-08-02 DIAGNOSIS — Z760 Encounter for issue of repeat prescription: Secondary | ICD-10-CM | POA: Insufficient documentation

## 2019-08-02 MED ORDER — LISINOPRIL 20 MG PO TABS: 20.0000 mg | ORAL_TABLET | Freq: Every day | ORAL | 0 refills | Status: DC

## 2019-08-02 MED ORDER — ACETAMINOPHEN 325 MG PO TABS
650.0000 mg | ORAL_TABLET | Freq: Once | ORAL | Status: AC
  Administered 2019-08-02: 14:00:00 650 mg via ORAL
  Filled 2019-08-02: qty 2
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
## 2019-08-02 NOTE — Discharge Instructions (Signed)
Please follow up with the Concussion Clinic within 5-7 days for re-evaluation of your symptoms   Please return to the emergency department for any new or worsening symptoms.
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
## 2019-08-24 ENCOUNTER — Other Ambulatory Visit: Payer: Self-pay

## 2019-08-25 ENCOUNTER — Ambulatory Visit (INDEPENDENT_AMBULATORY_CARE_PROVIDER_SITE_OTHER): Payer: Self-pay | Admitting: Family Medicine

## 2019-08-25 ENCOUNTER — Ambulatory Visit (INDEPENDENT_AMBULATORY_CARE_PROVIDER_SITE_OTHER): Payer: Self-pay

## 2019-08-25 ENCOUNTER — Encounter: Payer: Self-pay | Admitting: Family Medicine

## 2019-08-25 VITALS — BP 120/90 | HR 73 | Temp 97.9°F | Wt 224.3 lb

## 2019-08-25 DIAGNOSIS — R0789 Other chest pain: Secondary | ICD-10-CM

## 2019-08-25 DIAGNOSIS — I10 Essential (primary) hypertension: Secondary | ICD-10-CM

## 2019-08-25 DIAGNOSIS — T781XXS Other adverse food reactions, not elsewhere classified, sequela: Secondary | ICD-10-CM

## 2019-08-25 DIAGNOSIS — D649 Anemia, unspecified: Secondary | ICD-10-CM

## 2019-08-25 DIAGNOSIS — R42 Dizziness and giddiness: Secondary | ICD-10-CM

## 2019-08-25 DIAGNOSIS — Z13220 Encounter for screening for lipoid disorders: Secondary | ICD-10-CM

## 2019-08-25 DIAGNOSIS — N63 Unspecified lump in unspecified breast: Secondary | ICD-10-CM

## 2019-08-25 DIAGNOSIS — Z131 Encounter for screening for diabetes mellitus: Secondary | ICD-10-CM

## 2019-08-25 LAB — LIPID PANEL
Cholesterol: 135 mg/dL (ref 0–200)
HDL: 50.7 mg/dL (ref >39.00)
LDL Cholesterol: 74 mg/dL (ref 0–99)
NonHDL: 84.14
Total CHOL/HDL Ratio: 3
Triglycerides: 51 mg/dL (ref 0.0–149.0)
VLDL: 10.2 mg/dL (ref 0.0–40.0)

## 2019-08-25 LAB — COMPREHENSIVE METABOLIC PANEL
ALT: 36 U/L (ref 0–53)
AST: 24 U/L (ref 0–37)
Albumin: 4.6 g/dL (ref 3.5–5.2)
Alkaline Phosphatase: 52 U/L (ref 39–117)
BUN: 14 mg/dL (ref 6–23)
CO2: 31 mEq/L (ref 19–32)
Calcium: 9.8 mg/dL (ref 8.4–10.5)
Chloride: 103 mEq/L (ref 96–112)
Creatinine, Ser: 1.11 mg/dL (ref 0.40–1.50)
GFR: 91.48 mL/min (ref >60.00)
Glucose, Bld: 87 mg/dL (ref 70–99)
Potassium: 4.6 mEq/L (ref 3.5–5.1)
Sodium: 139 mEq/L (ref 135–145)
Total Bilirubin: 0.4 mg/dL (ref 0.2–1.2)
Total Protein: 7.4 g/dL (ref 6.0–8.3)

## 2019-08-25 LAB — VITAMIN B12: Vitamin B-12: 259 pg/mL (ref 211–911)

## 2019-08-25 LAB — IBC + FERRITIN
Ferritin: 61 ng/mL (ref 22.0–322.0)
Iron: 78 ug/dL (ref 42–165)
Saturation Ratios: 18.1 % — ABNORMAL LOW (ref 20.0–50.0)
Transferrin: 307 mg/dL (ref 212.0–360.0)

## 2019-08-25 LAB — TSH: TSH: 1.31 u[IU]/mL (ref 0.35–4.50)

## 2019-08-25 LAB — VITAMIN D 25 HYDROXY (VIT D DEFICIENCY, FRACTURES): VITD: 23.79 ng/mL — ABNORMAL LOW (ref 30.00–100.00)

## 2019-08-25 LAB — HEMOGLOBIN A1C: Hgb A1c MFr Bld: 5.8 % (ref 4.6–6.5)

## 2019-08-25 NOTE — Patient Instructions (Signed)
Check blood pressures at home and write down these numbers. Bring home cuff to next visit.

## 2019-08-25 NOTE — Progress Notes (Signed)
Jeffrey Johnson DOB: 1985/06/13 Encounter date: 08/25/2019  This is a 34 y.o. male who presents to establish care. Chief Complaint  Patient presents with  . Establish Care  . Dizziness    patient states he often feels dizzy and BP is elevated despite taking Lisinopril  . 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 patient complains of a "knot" noted on the left side of his     History of present illness: If he stands up, feels like he is unstable/swaying. He drives fork lift all night; even when on it he feels like lower legs are tight.   If he sits with legs in figure 4 toes go numb - this is more recent. Worries about circulation. Any crossing of legs seems to cause some numbness.   Just out of prison last year. No meds until last 3 years. Went to funeral and was stressed with grandfather's passing and was put on lisinopril. Restarted at ER when he was there end of March.   Feels like dizziness started after lisinopril started. Has some tightness in left side of chest. Just stays tight. No history of breathing issues. Not painful; just tight. States tightness is superficial and he can feel it.   Thinks headaches coming from blood pressure issues. Also wonders about sinus issues. Wonders if allergic. Worse now; but always some congestion.   Working on eating healthy, taking vitamins.   Smoking is more recent. Just started in Jan.   Working nights 11p-7a. This has been ok for him.   Past Medical History:  Diagnosis Date  . History of combined right and left heart catheterization   . Hypertension    History reviewed. No pertinent surgical history. No Known Allergies Current Meds  Medication Sig  . lisinopril (ZESTRIL) 20 MG tablet Take 1 tablet (20 mg total) by mouth daily.   Social History   Tobacco Use  . Smoking status: Current Some Day Smoker    Types: Cigarettes  . Smokeless tobacco:  Never Used  Substance Use Topics  . Alcohol use: Yes    Comment: occasionally   Family History  Problem Relation Age of Onset  . Diabetes Other   . Diabetes Other   . Diabetes Mother   . Diabetes Father      Review of Systems  Constitutional: Negative for chills, fatigue and fever.  HENT: Positive for congestion.   Respiratory: Negative for cough, chest tightness, shortness of breath and wheezing.   Cardiovascular: Negative for chest pain, palpitations and leg swelling.  Neurological: Positive for dizziness and headaches.    Objective:  BP 120/90 (BP Location: Left Arm, Patient Position: Sitting, Cuff Size: Large)   Pulse 73   Temp 97.9 F (36.6 C) (Temporal)   Wt 224 lb 4.8 oz (101.7 kg)   SpO2 97%   Weight: 224 lb 4.8 oz (101.7 kg)   BP Readings from Last 3 Encounters:  08/25/19 120/90  08/02/19 (!) 134/108  07/12/19 (!) 145/94   Wt Readings from Last 3 Encounters:  08/25/19 224 lb 4.8 oz (101.7 kg)    Physical Exam Constitutional:      General: He is not in acute distress.    Appearance: He is well-developed. He is not diaphoretic.  HENT:     Head: Normocephalic and atraumatic.     Right Ear: Tympanic membrane, ear canal and external ear normal.  Left Ear: Tympanic membrane, ear canal and external ear normal.     Mouth/Throat:     Mouth: Mucous membranes are moist.     Pharynx: Oropharynx is clear. No oropharyngeal exudate or posterior oropharyngeal erythema.  Eyes:     Conjunctiva/sclera: Conjunctivae normal.     Pupils: Pupils are equal, round, and reactive to light.  Neck:     Thyroid: No thyromegaly.  Cardiovascular:     Rate and Rhythm: Normal rate and regular rhythm.     Pulses:          Dorsalis pedis pulses are 2+ on the right side and 2+ on the left side.       Posterior tibial pulses are 2+ on the right side and 2+ on the left side.     Heart sounds: Murmur present. Systolic murmur present with a grade of 2/6. No friction rub. No gallop.    Pulmonary:     Effort: Pulmonary effort is normal. No respiratory distress.     Breath sounds: Normal breath sounds. No wheezing or rales.  Chest:     Comments: Soft, somewhat fluctuant mass left breast; slightly tender to touch, central. approx 3cm; nondiscreet edges. Musculoskeletal:     Cervical back: Neck supple.     Right lower leg: No edema.     Left lower leg: No edema.  Lymphadenopathy:     Cervical: No cervical adenopathy.  Skin:    General: Skin is warm and dry.  Neurological:     Mental Status: He is alert and oriented to person, place, and time.     Cranial Nerves: No cranial nerve deficit.     Motor: No abnormal muscle tone.     Deep Tendon Reflexes: Reflexes normal.     Reflex Scores:      Tricep reflexes are 2+ on the right side and 2+ on the left side.      Bicep reflexes are 2+ on the right side and 2+ on the left side.      Brachioradialis reflexes are 2+ on the right side and 2+ on the left side.      Patellar reflexes are 2+ on the right side and 2+ on the left side. Psychiatric:        Behavior: Behavior normal.     Assessment/Plan:  1. Hypertension, unspecified type Blood pressure slightly elevated today.  Have asked him to monitor this at home and bring his cuff in to next visit so that we can better adjust.  Uncertain cause of dizziness at this point, but we will further assess based on blood pressure medicine blood work. - CBC with Differential/Platelet; Future - Comprehensive metabolic panel; Future - CBC with Differential/Platelet - Comprehensive metabolic panel  2. Dizziness Normal neuro exam in the office today.  Normal orthostatic pressures.  EKG stable from previous.  Will check blood work and consider further evaluation pending these results. - TSH; Future - VITAMIN D 25 Hydroxy (Vit-D Deficiency, Fractures); Future - TSH - VITAMIN D 25 Hydroxy (Vit-D Deficiency, Fractures)  3. Chest tightness He does have a benign sounding heart murmur.   I do think it be reasonable in future to obtain a 2D echo for baseline, but EKG is normal sinus rhythm without any acute changes and reassuring. - EKG 12-Lead - DG Chest 2 View; Future - DG Chest 2 View  4. Lipid screening - Lipid panel; Future - Lipid panel  5. Screening for diabetes mellitus - Hemoglobin A1c; Future - Hemoglobin  A1c  6. Anemia, unspecified type - Vitamin B12; Future - IBC + Ferritin; Future - Vitamin B12 - IBC + Ferritin  7. Adverse food reaction, sequela He is having swelling in his temples with eating but has not pinpointed any specific food.  He did have some knots the other day and feels this could potentially be related, but has not had allergy response to meds before.  Consider allergy referral but will add on not panel to blood work today. - Allergy Panel 18, [employer]  8. Breast mass in male - US BREAST COMPLETE UNI LEFT INC AXILLA; Future  Return for pending labs, xr results.  Theodis Shove, MD

## 2019-08-26 LAB — CBC WITH DIFFERENTIAL/PLATELET
Basophils Absolute: 0.1 10*3/uL (ref 0.0–0.1)
Basophils Relative: 1.4 % (ref 0.0–3.0)
Eosinophils Absolute: 0.3 10*3/uL (ref 0.0–0.7)
Eosinophils Relative: 5.4 % — ABNORMAL HIGH (ref 0.0–5.0)
HCT: 42.2 % (ref 39.0–52.0)
Hemoglobin: 14 g/dL (ref 13.0–17.0)
Lymphocytes Relative: 33.6 % (ref 12.0–46.0)
Lymphs Abs: 2.1 10*3/uL (ref 0.7–4.0)
MCHC: 33.2 g/dL (ref 30.0–36.0)
MCV: 89.2 fl (ref 78.0–100.0)
Monocytes Absolute: 0.4 10*3/uL (ref 0.1–1.0)
Monocytes Relative: 7 % (ref 3.0–12.0)
Neutro Abs: 3.3 10*3/uL (ref 1.4–7.7)
Neutrophils Relative %: 52.6 % (ref 43.0–77.0)
Platelets: 222 10*3/uL (ref 150.0–400.0)
RBC: 4.72 Mil/uL (ref 4.22–5.81)
RDW: 13.7 % (ref 11.5–15.5)
WBC: 6.4 10*3/uL (ref 4.0–10.5)

## 2019-08-26 LAB — ALLERGY PANEL 18, NUT MIX GROUP
Almonds: <0.1 kU/L
CLASS: 0
CLASS: 0
CLASS: 0
CLASS: 0
CLASS: 0
CLASS: 0
Cashew IgE: <0.1 kU/L
Class: 0
Coconut: <0.1 kU/L
Hazelnut: <0.1 kU/L
Peanut IgE: <0.1 kU/L
Pecan Nut: <0.1 kU/L
Sesame Seed f10: <0.1 kU/L

## 2019-08-26 LAB — INTERPRETATION:

## 2019-08-27 ENCOUNTER — Other Ambulatory Visit: Payer: Self-pay | Admitting: Family Medicine

## 2019-08-27 DIAGNOSIS — N63 Unspecified lump in unspecified breast: Secondary | ICD-10-CM

## 2019-09-01 ENCOUNTER — Ambulatory Visit (HOSPITAL_COMMUNITY)
Admission: EM | Admit: 2019-09-01 | Discharge: 2019-09-01 | Disposition: A | Discharge status: Home / Self Care | Payer: Medicaid Other | Attending: Emergency Medicine | Admitting: Emergency Medicine

## 2019-09-01 ENCOUNTER — Other Ambulatory Visit: Payer: Self-pay

## 2019-09-01 ENCOUNTER — Encounter (HOSPITAL_COMMUNITY): Payer: Self-pay

## 2019-09-01 DIAGNOSIS — Z1152 Encounter for screening for COVID-19: Secondary | ICD-10-CM

## 2019-09-01 DIAGNOSIS — Z113 Encounter for screening for infections with a predominantly sexual mode of transmission: Secondary | ICD-10-CM

## 2019-09-01 MED ORDER — LISINOPRIL 20 MG PO TABS
20.0000 mg | ORAL_TABLET | Freq: Every day | ORAL | 0 refills | Status: DC
Start: 2019-09-01 — End: 2019-10-20

## 2019-09-01 NOTE — ED Provider Notes (Signed)
[facility]   [id number] 09/01/19 Arrival Time: 0815   CC: Covid and STD screening  SUBJECTIVE:  Jeffrey Johnson is a 34 y.o. male who presents to the urgent care for STD and Covid screening.  Denies a precipitating event, recent sexual encounter or recent antibiotic use.  Patient is sexually active with 2 male partners.  Denies penile discharge.  Denies fever, chills, nausea, cough, shortness of breath, chest pain, chest tightness, vomiting, abdominal or pelvic pain, urinary symptoms,dyspareunia,  rashes or lesions.   No LMP for male patient. Current birth control method: Compliant with BC:  ROS: As per HPI.  All other pertinent ROS negative.     Past Medical History:  Diagnosis Date  . History of combined right and left heart catheterization   . Hypertension    History reviewed. No pertinent surgical history. No Known Allergies No current facility-administered medications on file prior to encounter.   Current Outpatient Medications on File Prior to Encounter  Medication Sig Dispense Refill  . lisinopril (ZESTRIL) 20 MG tablet Take 1 tablet (20 mg total) by mouth daily. 30 tablet 0    Social History   Socioeconomic History  . Marital status: Single    Spouse name: Not on file  . Number of children: Not on file  . Years of education: Not on file  . Highest education level: Not on file  Occupational History  . Not on file  Tobacco Use  . Smoking status: Current Some Day Smoker    Types: Cigarettes  . Smokeless tobacco: Never Used  Substance and Sexual Activity  . Alcohol use: Yes    Comment: occasionally  . Drug use: Not Currently    Types: Marijuana  . Sexual activity: Yes    Birth control/protection: None  Other Topics Concern  . Not on file  Social History Narrative  . Not on file   Social Determinants of Health   Financial Resource Strain:   . Difficulty of Paying Living Expenses:   Food Insecurity:   . Worried About Brewing technologist in the Last Year:   . Barista in the Last Year:   Transportation Needs:   . Freight forwarder (Medical):   Marland Kitchen Lack of Transportation (Non-Medical):   Physical Activity:   . Days of Exercise per Week:   . Minutes of Exercise per Session:   Stress:   . Feeling of Stress :   Social Connections:   . Frequency of Communication with Friends and Family:   . Frequency of Social Gatherings with Friends and Family:   . Attends Religious Services:   . Active Member of Clubs or Organizations:   . Attends Banker Meetings:   Marland Kitchen Marital Status:   Intimate Partner Violence:   . Fear of Current or Ex-Partner:   . Emotionally Abused:   Marland Kitchen Physically Abused:   . Sexually Abused:    Family History  Problem Relation Age of Onset  . Diabetes Other   . Diabetes Other   . Diabetes Mother   . Diabetes Father     OBJECTIVE:  Vitals:   09/01/19 0846 09/01/19 0850  BP: 136/83   Pulse: 64   Resp: 19   Temp: 98.2 F (36.8 C)   TempSrc: Oral   SpO2: 100%   Weight:  228 lb (103.4 kg)     General appearance: Alert, NAD, appears stated age Head: NCAT Throat: lips, mucosa, and tongue normal; teeth and gums normal  Lungs: CTA bilaterally without adventitious breath sounds Heart: regular rate and rhythm.  Radial pulses 2+ symmetrical bilaterally Back: no CVA tenderness Abdomen: soft, non-tender; bowel sounds normal; no masses or organomegaly; no guarding or rebound tenderness GU: penile self swab obtained Skin: warm and dry Psychological:  Alert and cooperative. Normal mood and affect.  LABS:  Results for orders placed or performed in visit on 08/25/19  Allergy Panel 18, [employer]  Result Value Ref Range   Almonds <0.10 kU/L   CLASS 0    Coconut <0.10 kU/L   CLASS 0    Peanut IgE <0.10 kU/L   Class 0    Pecan Nut <0.10 kU/L   CLASS 0    Sesame Seed f10  <0.10 kU/L   CLASS 0    Hazelnut <0.10 kU/L   CLASS 0    Cashew IgE <0.10 kU/L   CLASS 0   CBC  with Differential/Platelet  Result Value Ref Range   WBC 6.4 4.0 - 10.5 K/uL   RBC 4.72 4.22 - 5.81 Mil/uL   Hemoglobin 14.0 13.0 - 17.0 g/dL   HCT 42.2 39.0 - 52.0 %   MCV 89.2 78.0 - 100.0 fl   MCHC 33.2 30.0 - 36.0 g/dL   RDW 13.7 11.5 - 15.5 %   Platelets 222.0 150.0 - 400.0 K/uL   Neutrophils Relative % 52.6 43.0 - 77.0 %   Lymphocytes Relative 33.6 12.0 - 46.0 %   Monocytes Relative 7.0 3.0 - 12.0 %   Eosinophils Relative 5.4 (H) 0.0 - 5.0 %   Basophils Relative 1.4 0.0 - 3.0 %   Neutro Abs 3.3 1.4 - 7.7 K/uL   Lymphs Abs 2.1 0.7 - 4.0 K/uL   Monocytes Absolute 0.4 0.1 - 1.0 K/uL   Eosinophils Absolute 0.3 0.0 - 0.7 K/uL   Basophils Absolute 0.1 0.0 - 0.1 K/uL  Comprehensive metabolic panel  Result Value Ref Range   Sodium 139 135 - 145 mEq/L   Potassium 4.6 3.5 - 5.1 mEq/L   Chloride 103 96 - 112 mEq/L   CO2 31 19 - 32 mEq/L   Glucose, Bld 87 70 - 99 mg/dL   BUN 14 6 - 23 mg/dL   Creatinine, Ser 1.11 0.40 - 1.50 mg/dL   Total Bilirubin 0.4 0.2 - 1.2 mg/dL   Alkaline Phosphatase 52 39 - 117 U/L   AST 24 0 - 37 U/L   ALT 36 0 - 53 U/L   Total Protein 7.4 6.0 - 8.3 g/dL   Albumin 4.6 3.5 - 5.2 g/dL   GFR 91.48 >60.00 mL/min   Calcium 9.8 8.4 - 10.5 mg/dL  Lipid panel  Result Value Ref Range   Cholesterol 135 0 - 200 mg/dL   Triglycerides 51.0 0.0 - 149.0 mg/dL   HDL 50.70 >39.00 mg/dL   VLDL 10.2 0.0 - 40.0 mg/dL   LDL Cholesterol 74 0 - 99 mg/dL   Total CHOL/HDL Ratio 3    NonHDL 84.14   Hemoglobin A1c  Result Value Ref Range   Hgb A1c MFr Bld 5.8 4.6 - 6.5 %  TSH  Result Value Ref Range   TSH 1.31 0.35 - 4.50 uIU/mL  Vitamin B12  Result Value Ref Range   Vitamin B-12 259 211 - 911 pg/mL  VITAMIN D 25 Hydroxy (Vit-D Deficiency, Fractures)  Result Value Ref Range   VITD 23.79 (L) 30.00 - 100.00 ng/mL  IBC + Ferritin  Result Value Ref Range   Iron 78 42 -  165 ug/dL   Transferrin 812.7 517.0 - 360.0 mg/dL   Saturation Ratios 01.7 (L) 20.0 - 50.0 %    Ferritin 61.0 22.0 - 322.0 ng/mL  Interpretation:  Result Value Ref Range   Interpretation      Labs Reviewed - No data to display  ASSESSMENT & PLAN:  1. Screening for STD (sexually transmitted disease)   2. Encounter for screening for COVID-19     No orders of the defined types were placed in this encounter.   Pending: Labs Reviewed - No data to display  Covid test will take 2 to 7 days for results to return Penile self-swab obtained.  We will follow up with you regarding abnormal results If tests results are positive, please abstain from sexual activity until you and your partner(s) have been treated Follow up with PCP or [facility] if symptoms persists Return here or go to ER if you have any new or worsening symptoms fever, chills, nausea, vomiting, abdominal or pelvic pain, painful intercourse, vaginal discharge, vaginal bleeding, persistent symptoms despite treatment, etc...  Reviewed expectations re: course of current medical issues. Questions answered. Outlined signs and symptoms indicating need for more acute intervention. Patient verbalized understanding. After Visit Summary given.       Durward Parcel, FNP 09/01/19 [phone]

## 2019-09-01 NOTE — Discharge Instructions (Addendum)
Covid test will take 2 to 7 days for results to return Penile self-swab obtained.   We will follow up with you regarding abnormal results If tests results are positive, please abstain from sexual activity until you and your partner(s) have been treated Follow up with PCP or [facility] if symptoms persists Return here or go to ER if you have any new or worsening symptoms fever, chills, nausea, vomiting, abdominal or pelvic pain, painful intercourse, vaginal discharge, vaginal bleeding, persistent symptoms despite treatment, etc..Marland Kitchen

## 2019-09-01 NOTE — ED Triage Notes (Signed)
Pt is here wanting STD testing states he had unprotected sex 08/28/2019, Pt wants COVID testing after denying ALL SxS today.

## 2019-09-02 LAB — CYTOLOGY, (ORAL, ANAL, URETHRAL) ANCILLARY ONLY
Chlamydia: NEGATIVE
Comment: NEGATIVE
Comment: NEGATIVE
Comment: NORMAL
Neisseria Gonorrhea: NEGATIVE
Trichomonas: NEGATIVE

## 2019-09-02 LAB — SARS CORONAVIRUS 2 (TAT 6-24 HRS): SARS Coronavirus 2: NEGATIVE

## 2019-09-09 ENCOUNTER — Ambulatory Visit (HOSPITAL_COMMUNITY)
Admission: EM | Admit: 2019-09-09 | Discharge: 2019-09-09 | Disposition: A | Discharge status: Home / Self Care | Payer: BC Managed Care – PPO | Attending: Internal Medicine | Admitting: Internal Medicine

## 2019-09-09 ENCOUNTER — Ambulatory Visit
Admission: RE | Admit: 2019-09-09 | Discharge: 2019-09-09 | Disposition: A | Discharge status: Home / Self Care | Payer: Medicaid Other | Source: Ambulatory Visit | Attending: Family Medicine | Admitting: Family Medicine

## 2019-09-09 ENCOUNTER — Other Ambulatory Visit: Payer: Self-pay

## 2019-09-09 ENCOUNTER — Encounter (HOSPITAL_COMMUNITY): Payer: Self-pay

## 2019-09-09 DIAGNOSIS — R928 Other abnormal and inconclusive findings on diagnostic imaging of breast: Secondary | ICD-10-CM | POA: Diagnosis not present

## 2019-09-09 DIAGNOSIS — N6320 Unspecified lump in the left breast, unspecified quadrant: Secondary | ICD-10-CM | POA: Diagnosis not present

## 2019-09-09 DIAGNOSIS — N63 Unspecified lump in unspecified breast: Secondary | ICD-10-CM

## 2019-09-09 DIAGNOSIS — M545 Low back pain, unspecified: Secondary | ICD-10-CM

## 2019-09-09 DIAGNOSIS — R1033 Periumbilical pain: Secondary | ICD-10-CM

## 2019-09-09 DIAGNOSIS — K5900 Constipation, unspecified: Secondary | ICD-10-CM

## 2019-09-09 LAB — POCT URINALYSIS DIP (DEVICE)
Bilirubin Urine: NEGATIVE
Glucose, UA: NEGATIVE mg/dL
Hgb urine dipstick: NEGATIVE
Ketones, ur: NEGATIVE mg/dL
Leukocytes,Ua: NEGATIVE
Nitrite: NEGATIVE
Protein, ur: 30 mg/dL — AB
Specific Gravity, Urine: >=1.03 (ref 1.005–1.030)
Urobilinogen, UA: 1 mg/dL (ref 0.0–1.0)
pH: 6.5 (ref 5.0–8.0)

## 2019-09-09 MED ORDER — SENNOSIDES 8.6 MG PO TABS: 1.0000 | ORAL_TABLET | Freq: Every evening | ORAL | 1 refills | Status: DC

## 2019-09-09 MED ORDER — IBUPROFEN 600 MG PO TABS
600.0000 mg | ORAL_TABLET | Freq: Four times a day (QID) | ORAL | 0 refills | Status: DC | PRN
Start: 2019-09-09 — End: 2019-10-07

## 2019-09-09 NOTE — ED Triage Notes (Signed)
Pt c/o RLQ/RUQ abd pain for approx 1 week; right lower back/flank pain for approx 3-4 weeks. Also reports urinary urgency past week. Denies n/v/d, fever, chills.   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

## 2019-09-10 NOTE — ED Provider Notes (Signed)
[name]    CSN: [id number] Arrival date & time: 09/09/19  1210      History   Chief Complaint Chief Complaint  Patient presents with  . Abdominal Pain  . Back Pain    HPI Jeffrey Johnson is a 34 y.o. male comes to urgent care with complaints of lower abdominal pain of 1 week duration.  Patient has right lower back pain of 3 to 4 weeks duration.  He reports having some urinary urgency last week.  No fever or chills.  No nausea vomiting or diarrhea.  Patient is a Freight forwarder.  He denies any trauma or falls.  He works 40 hours a week.  No numbness or tingling in the lower extremities and no radiation of pain.  Pain is moderately severe.  Patient admits to being constipated.  HPI  Past Medical History:  Diagnosis Date  . History of combined right and left heart catheterization   . Hypertension     There are no problems to display for this patient.   History reviewed. No pertinent surgical history.     Home Medications    Prior to Admission medications   Medication Sig Start Date End Date Taking? Authorizing Provider  lisinopril (ZESTRIL) 20 MG tablet Take 1 tablet (20 mg total) by mouth daily. 09/01/19  Yes Avegno, Darrelyn Hillock, FNP  ibuprofen (ADVIL) 600 MG tablet Take 1 tablet (600 mg total) by mouth every 6 (six) hours as needed. 09/09/19   [name], [name], MD  senna (SENOKOT) 8.6 MG tablet Take 1 tablet (8.6 mg total) by mouth at bedtime. 09/09/19   LampteyMyrene Galas, MD    Family History Family History  Problem Relation Age of Onset  . Diabetes Other   . Diabetes Other   . Diabetes Mother   . Diabetes Father     Social History Social History   Tobacco Use  . Smoking status: Current Some Day Smoker    Types: Cigarettes  . Smokeless tobacco: Never Used  Substance Use Topics  . Alcohol use: Yes    Comment: occasionally  . Drug use: Not Currently    Types: Marijuana     Allergies   Patient has no known allergies.   Review of  Systems Review of Systems  Constitutional: Negative for activity change, chills, fatigue and fever.  Respiratory: Negative.   Cardiovascular: Negative.   Gastrointestinal: Positive for abdominal pain and constipation. Negative for nausea and vomiting.  Genitourinary: Negative.   Musculoskeletal: Negative for arthralgias, gait problem and joint swelling.  Skin: Negative.   Neurological: Negative for dizziness, light-headedness and headaches.  Psychiatric/Behavioral: Negative for confusion.     Physical Exam Triage Vital Signs ED Triage Vitals  Enc Vitals Group     BP 09/09/19 1245 (!) 145/96     Pulse Rate 09/09/19 1245 76     Resp 09/09/19 1245 18     Temp 09/09/19 1245 98.2 F (36.8 C)     Temp Source 09/09/19 1245 Oral     SpO2 09/09/19 1245 100 %     Weight --      Height --      Head Circumference --      Peak Flow --      Pain Score 09/09/19 1243 5     Pain Loc --      Pain Edu? --      Excl. in [place]? --    No data found.  Updated Vital Signs BP Marland Kitchen)  145/96 (BP Location: Right Arm)   Pulse 76   Temp 98.2 F (36.8 C) (Oral)   Resp 18   SpO2 100%   Visual Acuity Right Eye Distance:   Left Eye Distance:   Bilateral Distance:    Right Eye Near:   Left Eye Near:    Bilateral Near:     Physical Exam Vitals and nursing note reviewed.  Constitutional:      General: He is not in acute distress.    Appearance: He is well-developed. He is not ill-appearing.  Cardiovascular:     Rate and Rhythm: Normal rate and regular rhythm.     Heart sounds: Normal heart sounds.  Pulmonary:     Effort: Pulmonary effort is normal.     Breath sounds: Normal breath sounds.  Abdominal:     General: Abdomen is flat.     Palpations: Abdomen is soft. There is no shifting dullness or fluid wave.     Tenderness: There is abdominal tenderness in the suprapubic area. There is no guarding or rebound.     Hernia: No hernia is present.  Neurological:     Mental Status: He is alert.       UC Treatments / Results  Labs (all labs ordered are listed, but only abnormal results are displayed) Labs Reviewed  POCT URINALYSIS DIP (DEVICE) - Abnormal; Notable for the following components:      Result Value   Protein, ur 30 (*)    All other components within normal limits    EKG   Radiology US BREAST LTD UNI LEFT INC AXILLA  Result Date: 09/09/2019 CLINICAL DATA:  Persistent palpable lump in the left breast. EXAM: DIGITAL DIAGNOSTIC BILATERAL MAMMOGRAM WITH CAD AND TOMO ULTRASOUND LEFT BREAST COMPARISON:  Previous exam(s). ACR Breast Density Category a: The breast tissue is almost entirely fatty. FINDINGS: No suspicious masses, calcifications, distortion, or gynecomastia. Mammographic images were processed with CAD. On physical exam, no suspicious lumps are identified. Targeted ultrasound is performed, showing no sonographic abnormalities. IMPRESSION: No mammographic or sonographic abnormalities. No evidence of malignancy. RECOMMENDATION: Treatment of the patient's symptoms should be based on clinical and physical exam given lack of imaging findings. No imaging follow-up is necessary. I have discussed the findings and recommendations with the patient. If applicable, a reminder letter will be sent to the patient regarding the next appointment. BI-RADS CATEGORY  1: Negative. Electronically Signed   By: [name] M.D   On: 09/09/2019 11:13   MM DIAG BREAST TOMO BILATERAL  Result Date: 09/09/2019 CLINICAL DATA:  Persistent palpable lump in the left breast. EXAM: DIGITAL DIAGNOSTIC BILATERAL MAMMOGRAM WITH CAD AND TOMO ULTRASOUND LEFT BREAST COMPARISON:  Previous exam(s). ACR Breast Density Category a: The breast tissue is almost entirely fatty. FINDINGS: No suspicious masses, calcifications, distortion, or gynecomastia. Mammographic images were processed with CAD. On physical exam, no suspicious lumps are identified. Targeted ultrasound is performed, showing no sonographic  abnormalities. IMPRESSION: No mammographic or sonographic abnormalities. No evidence of malignancy. RECOMMENDATION: Treatment of the patient's symptoms should be based on clinical and physical exam given lack of imaging findings. No imaging follow-up is necessary. I have discussed the findings and recommendations with the patient. If applicable, a reminder letter will be sent to the patient regarding the next appointment. BI-RADS CATEGORY  1: Negative. Electronically Signed   By: [name] M.D   On: 09/09/2019 11:13    Procedures Procedures (including critical care time)  Medications Ordered in UC Medications -  No data to display  Initial Impression / Assessment and Plan / UC Course  I have reviewed the triage vital signs and the nursing notes.  Pertinent labs & imaging results that were available during my care of the patient were reviewed by me and considered in my medical decision making (see chart for details).     1.  Acute low back pain without sciatica: Gentle range of motion exercises Warm compress to be used after long hours of work Ibuprofen as needed for pain Return precautions given If patient develops lower extremity weakness, persistent numbness or worsening back pain-patient should return to urgent care to be evaluated:  2.  Abdominal pain secondary to constipation: Senokot at bedtime Increase fiber. Final Clinical Impressions(s) / UC Diagnoses   Final diagnoses:  Acute low back pain without sciatica, unspecified back pain laterality  Constipation, unspecified constipation type  Periumbilical abdominal pain   Discharge Instructions   None    ED Prescriptions    Medication Sig Dispense Auth. Provider   ibuprofen (ADVIL) 600 MG tablet Take 1 tablet (600 mg total) by mouth every 6 (six) hours as needed. 30 tablet Roshawna Colclasure, Britta Mccreedy, MD   senna (SENOKOT) 8.6 MG tablet Take 1 tablet (8.6 mg total) by mouth at bedtime. 30 tablet [place] Hijazi, Britta Mccreedy, MD      PDMP not reviewed this encounter.   Merrilee Jansky, MD 09/10/19 [phone]

## 2019-10-07 ENCOUNTER — Encounter (HOSPITAL_COMMUNITY): Payer: Self-pay

## 2019-10-07 ENCOUNTER — Ambulatory Visit (HOSPITAL_COMMUNITY)
Admission: EM | Admit: 2019-10-07 | Discharge: 2019-10-07 | Disposition: A | Discharge status: Home / Self Care | Payer: BC Managed Care – PPO | Attending: Internal Medicine | Admitting: Internal Medicine

## 2019-10-07 ENCOUNTER — Ambulatory Visit (HOSPITAL_BASED_OUTPATIENT_CLINIC_OR_DEPARTMENT_OTHER)
Admission: RE | Admit: 2019-10-07 | Discharge: 2019-10-07 | Disposition: A | Discharge status: Home / Self Care | Payer: BC Managed Care – PPO | Source: Ambulatory Visit | Attending: Internal Medicine | Admitting: Internal Medicine

## 2019-10-07 ENCOUNTER — Other Ambulatory Visit: Payer: Self-pay

## 2019-10-07 DIAGNOSIS — M79609 Pain in unspecified limb: Secondary | ICD-10-CM

## 2019-10-07 DIAGNOSIS — Z79899 Other long term (current) drug therapy: Secondary | ICD-10-CM | POA: Insufficient documentation

## 2019-10-07 DIAGNOSIS — F17210 Nicotine dependence, cigarettes, uncomplicated: Secondary | ICD-10-CM | POA: Insufficient documentation

## 2019-10-07 DIAGNOSIS — Z791 Long term (current) use of non-steroidal anti-inflammatories (NSAID): Secondary | ICD-10-CM | POA: Diagnosis not present

## 2019-10-07 DIAGNOSIS — M79661 Pain in right lower leg: Secondary | ICD-10-CM | POA: Insufficient documentation

## 2019-10-07 DIAGNOSIS — G8929 Other chronic pain: Secondary | ICD-10-CM | POA: Insufficient documentation

## 2019-10-07 DIAGNOSIS — Z20822 Contact with and (suspected) exposure to covid-19: Secondary | ICD-10-CM | POA: Insufficient documentation

## 2019-10-07 DIAGNOSIS — I10 Essential (primary) hypertension: Secondary | ICD-10-CM | POA: Insufficient documentation

## 2019-10-07 DIAGNOSIS — M5440 Lumbago with sciatica, unspecified side: Secondary | ICD-10-CM

## 2019-10-07 DIAGNOSIS — M79662 Pain in left lower leg: Secondary | ICD-10-CM | POA: Insufficient documentation

## 2019-10-07 MED ORDER — IBUPROFEN 600 MG PO TABS: 600.0000 mg | ORAL_TABLET | Freq: Four times a day (QID) | ORAL | 0 refills | Status: AC | PRN

## 2019-10-07 MED ORDER — CYCLOBENZAPRINE HCL 10 MG PO TABS
10.0000 mg | ORAL_TABLET | Freq: Two times a day (BID) | ORAL | 0 refills | Status: DC | PRN
Start: 2019-10-07 — End: 2020-02-14

## 2019-10-07 NOTE — ED Notes (Signed)
Jeffrey Johnson is calling vascular for pt to get vascular studies on legs done.

## 2019-10-07 NOTE — ED Provider Notes (Signed)
[facility]    CSN: [id number] Arrival date & time: 10/07/19  1347      History   Chief Complaint Chief Complaint  Patient presents with  . multiple complaints    HPI Jeffrey Johnson is a 34 y.o. male comes to the urgent care with a history of low back pain with radiation to both legs.  Also complains of bilateral leg swelling of 2 weeks duration.  Pain in the back is chronic and of moderate severity.  No trauma or falls.  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  No recent long travel.  No shortness of breath, cough or sputum production.  No hemoptysis.  Patient admits to having lower extremity swelling bilaterally.  No fever or chills.  No nausea or vomiting.Marland Kitchen   HPI  Past Medical History:  Diagnosis Date  . History of combined right and left heart catheterization   . Hypertension     There are no problems to display for this patient.   History reviewed. No pertinent surgical history.     Home Medications    Prior to Admission medications   Medication Sig Start Date End Date Taking? Authorizing Provider  cyclobenzaprine (FLEXERIL) 10 MG tablet Take 1 tablet (10 mg total) by mouth 2 (two) times daily as needed for muscle spasms. 10/07/19   Abdishakur Gottschall, Britta Mccreedy, MD  ibuprofen (ADVIL) 600 MG tablet Take 1 tablet (600 mg total) by mouth every 6 (six) hours as needed. 10/07/19   Kayana Thoen, Britta Mccreedy, MD  lisinopril (ZESTRIL) 20 MG tablet Take 1 tablet (20 mg total) by mouth daily. 09/01/19   AvegnoZachery Dakins, FNP    Family History Family History  Problem Relation Age of Onset  . Diabetes Other   . Diabetes Other   . Diabetes Mother   . Diabetes Father     Social History Social History   Tobacco Use  . Smoking status: Current Some Day Smoker    Types: Cigarettes  . Smokeless tobacco: Never Used  Substance Use Topics  . Alcohol use: Yes    Comment: occasionally  . Drug use: Not Currently   Types: Marijuana     Allergies   Patient has no known allergies.   Review of Systems Review of Systems  Constitutional: Negative.  Negative for appetite change, fatigue and unexpected weight change.  Respiratory: Negative.  Negative for cough, chest tightness and shortness of breath.   Cardiovascular: Negative for chest pain.  Genitourinary: Negative.   Musculoskeletal: Positive for arthralgias and back pain. Negative for joint swelling.  Skin: Negative.   Neurological: Negative for dizziness, light-headedness and headaches.     Physical Exam Triage Vital Signs ED Triage Vitals  Enc Vitals Group     BP 10/07/19 1415 (!) 141/81     Pulse Rate 10/07/19 1415 (!) 58     Resp 10/07/19 1415 18     Temp 10/07/19 1415 98.3 F (36.8 C)     Temp Source 10/07/19 1415 Oral     SpO2 10/07/19 1415 100 %     Weight 10/07/19 1416 230 lb (104.3 kg)     Height 10/07/19 1416 6\' 2"  (1.88 m)     Head Circumference --      Peak Flow --      Pain Score 10/07/19 1415 8     Pain Loc --      Pain Edu? --  Excl. in GC? --    No data found.  Updated Vital Signs BP (!) 141/81   Pulse (!) 58   Temp 98.3 F (36.8 C) (Oral)   Resp 18   Ht 6\' 2"  (1.88 m)   Wt 104.3 kg   SpO2 100%   BMI 29.53 kg/m   Visual Acuity Right Eye Distance:   Left Eye Distance:   Bilateral Distance:    Right Eye Near:   Left Eye Near:    Bilateral Near:     Physical Exam Vitals and nursing note reviewed.  Constitutional:      General: He is not in acute distress.    Appearance: He is not ill-appearing.  Cardiovascular:     Rate and Rhythm: Normal rate and regular rhythm.     Pulses: Normal pulses.     Heart sounds: Normal heart sounds.  Pulmonary:     Effort: Pulmonary effort is normal.     Breath sounds: Normal breath sounds. No wheezing or rhonchi.  Abdominal:     General: Bowel sounds are normal.     Palpations: Abdomen is soft.  Musculoskeletal:        General: Normal range of motion.      Right lower leg: Edema present.     Left lower leg: Edema present.  Skin:    Capillary Refill: Capillary refill takes less than 2 seconds.  Neurological:     General: No focal deficit present.     Mental Status: He is alert and oriented to person, place, and time.      UC Treatments / Results  Labs (all labs ordered are listed, but only abnormal results are displayed) Labs Reviewed  SARS CORONAVIRUS 2 (TAT 6-24 HRS)    EKG   Radiology LE VENOUS  Result Date: 10/07/2019  Lower Venous DVTStudy Indications: Swelling, and Pain.  Comparison Study: No prior study Performing Technologist: 12/07/2019 MHA, RDMS, RVT, RDCS  Examination Guidelines: A complete evaluation includes B-mode imaging, spectral Doppler, color Doppler, and power Doppler as needed of all accessible portions of each vessel. Bilateral testing is considered an integral part of a complete examination. Limited examinations for reoccurring indications may be performed as noted. The reflux portion of the exam is performed with the patient in reverse Trendelenburg.  +---------+---------------+---------+-----------+----------+--------------+ RIGHT    CompressibilityPhasicitySpontaneityPropertiesThrombus Aging +---------+---------------+---------+-----------+----------+--------------+ CFV      Full           Yes      Yes                                 +---------+---------------+---------+-----------+----------+--------------+ SFJ      Full                                                        +---------+---------------+---------+-----------+----------+--------------+ FV Prox  Full                                                        +---------+---------------+---------+-----------+----------+--------------+ FV Mid   Full                                                        +---------+---------------+---------+-----------+----------+--------------+  FV DistalFull                                                         +---------+---------------+---------+-----------+----------+--------------+ PFV      Full                                                        +---------+---------------+---------+-----------+----------+--------------+ POP      Full           Yes      Yes                                 +---------+---------------+---------+-----------+----------+--------------+ PTV      Full                                                        +---------+---------------+---------+-----------+----------+--------------+ PERO     Full                                                        +---------+---------------+---------+-----------+----------+--------------+   +---------+---------------+---------+-----------+----------+--------------+ LEFT     CompressibilityPhasicitySpontaneityPropertiesThrombus Aging +---------+---------------+---------+-----------+----------+--------------+ CFV      Full           Yes      Yes                                 +---------+---------------+---------+-----------+----------+--------------+ SFJ      Full                                                        +---------+---------------+---------+-----------+----------+--------------+ FV Prox  Full                                                        +---------+---------------+---------+-----------+----------+--------------+ FV Mid   Full                                                        +---------+---------------+---------+-----------+----------+--------------+ FV DistalFull                                                        +---------+---------------+---------+-----------+----------+--------------+  PFV      Full                                                        +---------+---------------+---------+-----------+----------+--------------+ POP      Full           Yes      Yes                                  +---------+---------------+---------+-----------+----------+--------------+ PTV      Full                                                        +---------+---------------+---------+-----------+----------+--------------+ PERO     Full                                                        +---------+---------------+---------+-----------+----------+--------------+     Summary: RIGHT: - There is no evidence of deep vein thrombosis in the lower extremity.  - No cystic structure found in the popliteal fossa.  LEFT: - There is no evidence of deep vein thrombosis in the lower extremity.  - No cystic structure found in the popliteal fossa.  *See table(s) above for measurements and observations.    Preliminary     Procedures Procedures (including critical care time)  Medications Ordered in UC Medications - No data to display  Initial Impression / Assessment and Plan / UC Course  I have reviewed the triage vital signs and the nursing notes.  Pertinent labs & imaging results that were available during my care of the patient were reviewed by me and considered in my medical decision making (see chart for details).     1.  Lower back pain with sciatica: Ibuprofen 600 mg every 6 hours as needed for pain Flexeril 10 mg twice daily Heat therapy to the back after a long days work DVT screen for bilateral calf tenderness Return to urgent care if symptoms worsen. Final Clinical Impressions(s) / UC Diagnoses   Final diagnoses:  Back pain of lumbar region with sciatica  Bilateral calf pain   Discharge Instructions   None    ED Prescriptions    Medication Sig Dispense Auth. Provider   ibuprofen (ADVIL) 600 MG tablet Take 1 tablet (600 mg total) by mouth every 6 (six) hours as needed. 30 tablet Muzammil Bruins, [name], MD   cyclobenzaprine (FLEXERIL) 10 MG tablet Take 1 tablet (10 mg total) by mouth 2 (two) times daily as needed for muscle spasms. 20 tablet Eneida Evers, [name], MD     PDMP not  reviewed this encounter.   Chase Picket, MD 10/07/19 [phone]

## 2019-10-07 NOTE — Progress Notes (Signed)
Bilateral lower extremity venous duplex completed. Refer to "CV Proc" under chart review to view preliminary results.  10/07/2019 4:57 PM Eula Fried., MHA, RVT, RDCS, RDMS

## 2019-10-07 NOTE — ED Triage Notes (Signed)
Pt c/o 8/10 sharp mid lower back painx2 wks. Pt states left leg numb and tingling. Pt c/o 7/10 throbbing pain in legs bilatx2 wks. Pt walked well to exam room.Pt states legs swollen. Pt has 1+ edema of legs bilat. 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

## 2019-10-07 NOTE — ED Notes (Signed)
Called and spoke to [facility]do[facility] to schedule pt for study. Scheduled pt for today at 1630. Advised pt of same.

## 2019-10-08 LAB — SARS CORONAVIRUS 2 (TAT 6-24 HRS): SARS Coronavirus 2: NEGATIVE

## 2019-10-20 ENCOUNTER — Other Ambulatory Visit: Payer: Self-pay

## 2019-10-20 ENCOUNTER — Ambulatory Visit (INDEPENDENT_AMBULATORY_CARE_PROVIDER_SITE_OTHER): Payer: BC Managed Care – PPO | Admitting: Family Medicine

## 2019-10-20 ENCOUNTER — Encounter: Payer: Self-pay | Admitting: Family Medicine

## 2019-10-20 VITALS — BP 142/82 | HR 88 | Temp 97.9°F | Ht 74.75 in | Wt 239.0 lb

## 2019-10-20 DIAGNOSIS — M545 Low back pain, unspecified: Secondary | ICD-10-CM

## 2019-10-20 DIAGNOSIS — M7122 Synovial cyst of popliteal space [Baker], left knee: Secondary | ICD-10-CM

## 2019-10-20 DIAGNOSIS — T7840XD Allergy, unspecified, subsequent encounter: Secondary | ICD-10-CM

## 2019-10-20 DIAGNOSIS — R011 Cardiac murmur, unspecified: Secondary | ICD-10-CM | POA: Diagnosis not present

## 2019-10-20 DIAGNOSIS — Z0000 Encounter for general adult medical examination without abnormal findings: Secondary | ICD-10-CM | POA: Diagnosis not present

## 2019-10-20 MED ORDER — LISINOPRIL 20 MG PO TABS: 20.0000 mg | ORAL_TABLET | Freq: Every day | ORAL | 1 refills | Status: DC

## 2019-10-20 NOTE — Progress Notes (Signed)
Jeffrey Johnson DOB: 03/12/1986 Encounter date: 10/20/2019  This is a 34 y.o. male who presents for complete physical   History of present illness/Additional concerns: Recently was in urgent care for bilateral lower extremity swelling.  Ultrasound bilateral lower extremities revealed no DVT. If he straightens left leg - feels tight all the way down. Lower back been hurting him - right in middle. Drives fork lift at work. No known injury. Feels like flexeril helped. Ibuprofen helped. Using blue emu gel as well. Taking ibuprofen about once/day.   Has been checking bp every day: yesterday was 115/50. That was early morning, but after he took medicine. Usually numbers lower than in office, but was checking lying down.  States the chest pain is resolved.  He is feeling better overall.  No issues feeling short of breath.   ?hx right and left heart cath: in 2016 - while incarcerated went to medical and EKG was abnormal and said he was having massive heart attack. Was told that everything was normal.  Seems like when he is eating temples are still swelling. Ate meat the other day and felt like whole head felt tight.  Cannot always connect this to a specific food.  Does seem to happen when he is eating well.  Can sometimes get headache associated with this.   Past Medical History:  Diagnosis Date   History of combined right and left heart catheterization    Hypertension    History reviewed. No pertinent surgical history. No Known Allergies Current Meds  Medication Sig   cyclobenzaprine (FLEXERIL) 10 MG tablet Take 1 tablet (10 mg total) by mouth 2 (two) times daily as needed for muscle spasms.   ibuprofen (ADVIL) 600 MG tablet Take 1 tablet (600 mg total) by mouth every 6 (six) hours as needed.   lisinopril (ZESTRIL) 20 MG tablet Take 1 tablet (20 mg total) by mouth daily.   [DISCONTINUED] lisinopril (ZESTRIL) 20 MG tablet Take 1 tablet (20 mg total) by mouth daily.    [DISCONTINUED] lisinopril (ZESTRIL) 20 MG tablet Take 1 tablet (20 mg total) by mouth daily.   Social History   Tobacco Use   Smoking status: Current Some Day Smoker    Types: Cigarettes   Smokeless tobacco: Never Used  Substance Use Topics   Alcohol use: Yes    Comment: occasionally   Family History  Problem Relation Age of Onset   Diabetes Other    Diabetes Other    Diabetes Mother    Diabetes Father      Review of Systems  Constitutional: Negative for chills, fatigue and fever.  HENT: Positive for facial swelling (just in temples).   Respiratory: Negative for cough, chest tightness, shortness of breath and wheezing.   Cardiovascular: Negative for chest pain, palpitations and leg swelling.  Musculoskeletal: Positive for back pain.  Neurological: Negative for weakness and numbness.    CBC:  Lab Results  Component Value Date   WBC 6.4 08/25/2019   HGB 14.0 08/25/2019   HCT 42.2 08/25/2019   MCH 29.8 03/21/2019   MCHC 33.2 08/25/2019   RDW 13.7 08/25/2019   PLT 222.0 08/25/2019   CMP: Lab Results  Component Value Date   NA 139 08/25/2019   K 4.6 08/25/2019   CL 103 08/25/2019   CO2 31 08/25/2019   ANIONGAP 8 03/21/2019   GLUCOSE 87 08/25/2019   BUN 14 08/25/2019   CREATININE 1.11 08/25/2019   GFRAA >60 03/21/2019   CALCIUM 9.8 08/25/2019   PROT  7.4 08/25/2019   BILITOT 0.4 08/25/2019   ALKPHOS 52 08/25/2019   ALT 36 08/25/2019   AST 24 08/25/2019   LIPID: Lab Results  Component Value Date   CHOL 135 08/25/2019   TRIG 51.0 08/25/2019   HDL 50.70 08/25/2019   LDLCALC 74 08/25/2019    Objective:  BP (!) 142/82    Pulse 88    Temp 97.9 F (36.6 C) (Other (Comment))    Ht 6' 2.75" (1.899 m)    Wt 239 lb (108.4 kg)    SpO2 97%    BMI 30.07 kg/m   Weight: 239 lb (108.4 kg)   BP Readings from Last 3 Encounters:  10/20/19 (!) 142/82  10/07/19 (!) 141/81  09/09/19 (!) 145/96   Wt Readings from Last 3 Encounters:  10/20/19 239 lb (108.4 kg)   10/07/19 230 lb (104.3 kg)  09/01/19 228 lb (103.4 kg)    Physical Exam Constitutional:      General: He is not in acute distress.    Appearance: He is well-developed.  HENT:     Head: Normocephalic and atraumatic.     Right Ear: External ear normal.     Left Ear: External ear normal.     Nose: Nose normal.     Mouth/Throat:     Pharynx: No oropharyngeal exudate.  Eyes:     Conjunctiva/sclera: Conjunctivae normal.     Pupils: Pupils are equal, round, and reactive to light.  Neck:     Thyroid: No thyromegaly.  Cardiovascular:     Rate and Rhythm: Normal rate and regular rhythm.     Heart sounds: Murmur heard.  Systolic murmur is present with a grade of 2/6.  No friction rub. No gallop.   Pulmonary:     Effort: Pulmonary effort is normal. No respiratory distress.     Breath sounds: Normal breath sounds. No stridor. No wheezing or rales.  Abdominal:     General: Bowel sounds are normal.     Palpations: Abdomen is soft.     Hernia: There is no hernia in the left inguinal area or right inguinal area.  Genitourinary:    Penis: Normal.      Testes: Normal.        Right: Mass or tenderness not present.        Left: Mass, tenderness or swelling not present.     Epididymis:     Right: Normal.     Left: Normal.  Musculoskeletal:        General: Normal range of motion.     Cervical back: Neck supple.     Right lower leg: No edema.     Left lower leg: No edema.     Comments: Full range of motion of back, but there is increased mid lower back tenderness with extension or with twisting.  No radiation of pain with twist or extension.  Slight tightness with side tilt.  There is some paralumbar muscle spasm to palpation.  There is fullness to posterior left knee with tenderness on palpation.  No other knee tenderness is reproducible on exam.  Skin:    General: Skin is warm and dry.  Neurological:     Mental Status: He is alert and oriented to person, place, and time.     Deep  Tendon Reflexes: Babinski sign absent on the right side. Babinski sign absent on the left side.     Reflex Scores:      Patellar reflexes are 2+ on the right  side and 2+ on the left side.      Achilles reflexes are 1+ on the right side and 1+ on the left side. Psychiatric:        Behavior: Behavior normal.        Thought Content: Thought content normal.        Judgment: Judgment normal.     Assessment/Plan: Health Maintenance Due  Topic Date Due   Hepatitis C Screening  Never done   COVID-19 Vaccine (1) Never done   TETANUS/TDAP  Never done   Health Maintenance reviewed.  1. Preventative health care He is working on healthier eating.  We have discussed benefit of quitting smoking as well.  Can get Covid vaccine at any pharmacy.  We will make sure he has this information when we call with echo results since I did not give it to him at the office.  2. Baker's cyst of knee, left I suspect the tightness in the leg is secondary to Baker's cyst since there is a palpable abnormality here.  Going to have him see Ortho since this is an ongoing and worsening problem.  It affects him through his day at work. - Ambulatory referral to Sports Medicine  3. Acute midline low back pain without sciatica We discussed importance stretches to do to help with back discomfort, but I would like for him to see Ortho for further evaluation.  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 - Ambulatory referral to Sports Medicine  4. Heart murmur Murmur does not sound concerning, but he has not heard that he has had a murmur before so would like to get a baseline. - ECHOCARDIOGRAM COMPLETE; Future  5. Allergic reaction, subsequent encounter His temples are quite swollen and the seem to get enlarged with eating. This does not seem associated just with harder foods, and there is not TMJ tenderness noted. He has other congestion/allergy symptoms and I  think worthwhile to have full allergy eval. - Ambulatory referral to Allergy  Return for pending echo results.  Micheline Rough, MD

## 2019-10-20 NOTE — Patient Instructions (Addendum)
Health Maintenance Due  Topic Date Due  . Hepatitis C Screening  Never done  . COVID-19 Vaccine (1) Never done  . TETANUS/TDAP  Never done    Depression screen PHQ 2/9 10/20/2019  Decreased Interest 0  Down, Depressed, Hopeless 0  PHQ - 2 Score 0   Baker Cyst  A Baker cyst, also called a popliteal cyst, is a growth that forms at the back of the knee. The cyst forms when the fluid-filled sac (bursa) that cushions the knee joint becomes enlarged. What are the causes? In most cases, a Baker cyst results from another knee problem that causes swelling inside the knee. This makes the fluid inside the knee joint (synovial fluid) flow into the bursa behind the knee, causing the bursa to enlarge. What increases the risk? You may be more likely to develop a Baker cyst if you already have a knee problem, such as:  A tear in cartilage that cushions the knee joint (meniscal tear).  A tear in the tissues that connect the bones of the knee joint (ligament tear).  Knee swelling from osteoarthritis, rheumatoid arthritis, or gout. What are the signs or symptoms? The main symptom of this condition is a lump behind the knee. This may be the only symptom of the condition. The lump may be painful, especially when the knee is straightened. If the lump is painful, the pain may come and go. The knee may also be stiff. Symptoms may quickly get more severe if the cyst breaks open (ruptures). If the cyst ruptures, you may feel the following in your knee and calf:  Sudden or worsening pain.  Swelling.  Bruising.  Redness in the calf. A Baker cyst does not always cause symptoms. How is this diagnosed? This condition may be diagnosed based on your symptoms and medical history. Your health care provider will also do a physical exam. This may include:  Feeling the cyst to check whether it is tender.  Checking your knee for signs of another knee condition that causes swelling. You may have imaging tests,  such as:  X-rays.  MRI.  Ultrasound. How is this treated? A Baker cyst that is not painful may go away without treatment. If the cyst gets large or painful, it will likely get better if the underlying knee problem is treated. If needed, treatment for a Baker cyst may include:  Resting.  Keeping weight off of the knee. This means not leaning on the knee to support your body weight.  Taking NSAIDs, such as ibuprofen, to reduce pain and swelling.  Having a procedure to drain the fluid from the cyst with a needle (aspiration). You may also get an injection of a medicine that reduces swelling (steroid).  Having surgery. This may be needed if other treatments do not work. This usually involves correcting knee damage and removing the cyst. Follow these instructions at home:  Activity  Rest as told by your health care provider.  Avoid activities that make pain or swelling worse.  Return to your normal activities as told by your health care provider. Ask your health care provider what activities are safe for you.  Do not use the injured limb to support your body weight until your health care provider says that you can. Use crutches as told by your health care provider. General instructions  Take over-the-counter and prescription medicines only as told by your health care provider.  Keep all follow-up visits as told by your health care provider. This is important. Contact  a health care provider if:  You have knee pain, stiffness, or swelling that does not get better. Get help right away if:  You have sudden or worsening pain and swelling in your calf area. Summary  A Baker cyst, also called a popliteal cyst, is a growth that forms at the back of the knee.  In most cases, a Baker cyst results from another knee problem that causes swelling inside the knee.  A Baker cyst that is not painful may go away without treatment.  If needed, treatment for a Baker cyst may include resting,  keeping weight off of the knee, medicines, or draining fluid from the cyst.  Surgery may be needed if other treatments are not effective. This information is not intended to replace advice given to you by your health care provider. Make sure you discuss any questions you have with your health care provider. Document Revised: 09/04/2018 Document Reviewed: 09/04/2018 Elsevier Patient Education  2020 [employer].

## 2019-11-18 ENCOUNTER — Other Ambulatory Visit (HOSPITAL_COMMUNITY): Payer: BC Managed Care – PPO

## 2019-11-22 ENCOUNTER — Encounter (HOSPITAL_COMMUNITY): Payer: Self-pay

## 2019-11-22 ENCOUNTER — Other Ambulatory Visit: Payer: Self-pay

## 2019-11-22 ENCOUNTER — Emergency Department (HOSPITAL_COMMUNITY): Payer: BC Managed Care – PPO

## 2019-11-22 ENCOUNTER — Emergency Department (HOSPITAL_COMMUNITY)
Admission: EM | Admit: 2019-11-22 | Discharge: 2019-11-22 | Disposition: A | Discharge status: Home / Self Care | Payer: BC Managed Care – PPO | Attending: Emergency Medicine | Admitting: Emergency Medicine

## 2019-11-22 DIAGNOSIS — R600 Localized edema: Secondary | ICD-10-CM | POA: Diagnosis not present

## 2019-11-22 DIAGNOSIS — Z5321 Procedure and treatment not carried out due to patient leaving prior to being seen by health care provider: Secondary | ICD-10-CM | POA: Diagnosis not present

## 2019-11-22 DIAGNOSIS — R079 Chest pain, unspecified: Secondary | ICD-10-CM | POA: Diagnosis not present

## 2019-11-22 DIAGNOSIS — M7989 Other specified soft tissue disorders: Secondary | ICD-10-CM | POA: Diagnosis not present

## 2019-11-22 LAB — BASIC METABOLIC PANEL
Anion gap: 10 (ref 5–15)
BUN: 11 mg/dL (ref 6–20)
CO2: 29 mmol/L (ref 22–32)
Calcium: 9.5 mg/dL (ref 8.9–10.3)
Chloride: 101 mmol/L (ref 98–111)
Creatinine, Ser: 1.12 mg/dL (ref 0.61–1.24)
GFR calc Af Amer: >60 mL/min (ref >60)
GFR calc non Af Amer: >60 mL/min (ref >60)
Glucose, Bld: 98 mg/dL (ref 70–99)
Potassium: 3.6 mmol/L (ref 3.5–5.1)
Sodium: 140 mmol/L (ref 135–145)

## 2019-11-22 LAB — CBC
HCT: 44.7 % (ref 39.0–52.0)
Hemoglobin: 14.7 g/dL (ref 13.0–17.0)
MCH: 29.8 pg (ref 26.0–34.0)
MCHC: 32.9 g/dL (ref 30.0–36.0)
MCV: 90.5 fL (ref 80.0–100.0)
Platelets: 269 10*3/uL (ref 150–400)
RBC: 4.94 MIL/uL (ref 4.22–5.81)
RDW: 12.6 % (ref 11.5–15.5)
WBC: 7.9 10*3/uL (ref 4.0–10.5)
nRBC: 0 % (ref 0.0–0.2)

## 2019-11-22 LAB — TROPONIN I (HIGH SENSITIVITY): Troponin I (High Sensitivity): 3 ng/L (ref <18)

## 2019-11-22 MED ORDER — SODIUM CHLORIDE 0.9% FLUSH: 3.0000 mL | Freq: Once | INTRAVENOUS | Status: DC

## 2019-11-22 NOTE — ED Triage Notes (Signed)
Patient c/o left lower leg swelling x 2 weeks and left chest pain x 2 days. Patient denies SOB, N/V.

## 2019-11-29 ENCOUNTER — Other Ambulatory Visit: Payer: Self-pay

## 2019-11-29 ENCOUNTER — Encounter: Payer: Self-pay | Admitting: Family Medicine

## 2019-11-29 ENCOUNTER — Ambulatory Visit (INDEPENDENT_AMBULATORY_CARE_PROVIDER_SITE_OTHER): Payer: BC Managed Care – PPO | Admitting: Family Medicine

## 2019-11-29 ENCOUNTER — Ambulatory Visit: Payer: Self-pay

## 2019-11-29 ENCOUNTER — Ambulatory Visit (INDEPENDENT_AMBULATORY_CARE_PROVIDER_SITE_OTHER): Payer: BC Managed Care – PPO

## 2019-11-29 VITALS — BP 110/80 | HR 81 | Ht 74.0 in | Wt 238.0 lb

## 2019-11-29 DIAGNOSIS — M5416 Radiculopathy, lumbar region: Secondary | ICD-10-CM

## 2019-11-29 DIAGNOSIS — M25562 Pain in left knee: Secondary | ICD-10-CM | POA: Diagnosis not present

## 2019-11-29 DIAGNOSIS — M545 Low back pain, unspecified: Secondary | ICD-10-CM

## 2019-11-29 DIAGNOSIS — G8929 Other chronic pain: Secondary | ICD-10-CM | POA: Diagnosis not present

## 2019-11-29 MED ORDER — VITAMIN D (ERGOCALCIFEROL) 1.25 MG (50000 UNIT) PO CAPS
50000.0000 [IU] | ORAL_CAPSULE | ORAL | 0 refills | Status: DC
Start: 2019-11-29 — End: 2020-04-03

## 2019-11-29 MED ORDER — PREDNISONE 50 MG PO TABS
ORAL_TABLET | ORAL | 0 refills | Status: DC
Start: 2019-11-29 — End: 2020-02-14

## 2019-11-29 MED ORDER — GABAPENTIN 100 MG PO CAPS: 200.0000 mg | ORAL_CAPSULE | Freq: Every day | ORAL | 3 refills | Status: AC
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
## 2019-11-29 NOTE — Patient Instructions (Addendum)
Good to see you Exercises for the back today Prednisone daily for 5 days and gabapentin 200 mg at night sent in Xrays today Once weekly vitamin D sent in Tart cherry extract vitamin 1200 mg at night See me again in 4-6 weeks

## 2019-11-29 NOTE — Assessment & Plan Note (Signed)
Could be secondary to more of a radicular symptoms.  Patient's knee appears to be unremarkable.
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
## 2019-12-07 ENCOUNTER — Other Ambulatory Visit: Payer: Self-pay

## 2019-12-07 ENCOUNTER — Ambulatory Visit (INDEPENDENT_AMBULATORY_CARE_PROVIDER_SITE_OTHER): Payer: BC Managed Care – PPO | Admitting: Allergy and Immunology

## 2019-12-07 ENCOUNTER — Encounter: Payer: Self-pay | Admitting: Allergy and Immunology

## 2019-12-07 VITALS — BP 140/80 | HR 117 | Temp 98.3°F | Resp 18 | Ht 74.0 in | Wt 239.2 lb

## 2019-12-07 DIAGNOSIS — I10 Essential (primary) hypertension: Secondary | ICD-10-CM | POA: Diagnosis not present

## 2019-12-07 DIAGNOSIS — Z79899 Other long term (current) drug therapy: Secondary | ICD-10-CM

## 2019-12-07 DIAGNOSIS — M316 Other giant cell arteritis: Secondary | ICD-10-CM | POA: Diagnosis not present

## 2019-12-07 MED ORDER — LOSARTAN POTASSIUM 50 MG PO TABS: 50.0000 mg | ORAL_TABLET | Freq: Every day | ORAL | 5 refills | Status: DC

## 2019-12-07 NOTE — Patient Instructions (Addendum)
  1. Blood - ANA w/R, ANCA w/R, RF, CCP, SED, CRP, C3, C4  2. Change lisinopril to losartan 50 mg - 1 tablet 1 time per day. Check BP  3. Obtain a eye exam for possible temporal arteritis   4. Visit with rheumatologist for possible temporal arteritis  5. Further evaluation?

## 2019-12-07 NOTE — Progress Notes (Signed)
[place] - [place] - [place] - [place] - [place]   Dear Dr. Hassan Rowan,  Thank you for referring Jeffrey Johnson to the [facility] [place] on 12/07/2019.   Below is a summation of this patient's evaluation and recommendations.  Thank you for your referral. I will keep you informed about this patient's response to treatment.   If you have any questions please do not hesitate to contact me.   Sincerely,  Jessica Priest, MD Allergy / Immunology [facility]   ______________________________________________________________________    NEW PATIENT NOTE  Referring Provider: Wynn Banker, MD Primary Provider: Wynn Banker, MD Date of office visit: 12/07/2019    Subjective:   Chief Complaint:  Jeffrey Johnson (DOB: 1986/03/21) is a 34 y.o. male who presents to the clinic on 12/07/2019 with a chief complaint of Allergic Reaction .     HPI: Jeffrey Johnson presents to this clinic in evaluation of temporal swelling.  Apparently for the past 3 months he has been having tightness and tenderness and swelling of his temporal regions bilaterally especially when he eats.  He has also been having headaches over the course of the past year in his frontal region that feels as though they are oscillating and sometimes give him intermittent dizziness and blurred vision.  These headaches appear to occur about 3 times per week and can last 1 to 2 hours per headache.  He does not have any vision changes other than described with his headache.  He does not appear to have any arthritis or other musculoskeletal issues except for numbness of his left lower extremity which is currently being evaluated.  It should be noted that he started lisinopril in 2017 for systemic arterial hypertension.  Past Medical History:  Diagnosis Date  . History of combined right and left heart catheterization   .  Hypertension     History reviewed. No pertinent surgical history.  Allergies as of 12/07/2019   No Known Allergies     Medication List      cyclobenzaprine 10 MG tablet Commonly known as: FLEXERIL Take 1 tablet (10 mg total) by mouth 2 (two) times daily as needed for muscle spasms.   gabapentin 100 MG capsule Commonly known as: NEURONTIN Take 2 capsules (200 mg total) by mouth at bedtime.   ibuprofen 600 MG tablet Commonly known as: ADVIL Take 1 tablet (600 mg total) by mouth every 6 (six) hours as needed.   lisinopril 20 MG tablet Commonly known as: ZESTRIL Take 1 tablet (20 mg total) by mouth daily.   predniSONE 50 MG tablet Commonly known as: DELTASONE 1 tablet by mouth daily   Vitamin D (Ergocalciferol) 1.25 MG (50000 UNIT) Caps capsule Commonly known as: DRISDOL Take 1 capsule (50,000 Units total) by mouth every 7 (seven) days.       Review of systems negative except as noted in HPI / PMHx or noted below:  Review of Systems  Constitutional: Negative.   HENT: Negative.   Eyes: Negative.   Respiratory: Negative.   Cardiovascular: Negative.   Gastrointestinal: Negative.   Genitourinary: Negative.   Musculoskeletal: Negative.   Skin: Negative.   Neurological: Negative.   Endo/Heme/Allergies: Negative.   Psychiatric/Behavioral: Negative.     Family History  Problem Relation Age of Onset  . Diabetes Other   . Diabetes Other   . Diabetes Mother   . Diabetes Father     Social History  Socioeconomic History  . Marital status: Single    Spouse name: Not on file  . Number of children: Not on file  . Years of education: Not on file  . Highest education level: Not on file  Occupational History  . Not on file  Tobacco Use  . Smoking status: Current Some Day Smoker  . Smokeless tobacco: Never Used  Vaping Use  . Vaping Use: Never used  Substance and Sexual Activity  . Alcohol use: Yes    Comment: occasionally  . Drug use: Not Currently    Types:  Marijuana  . Sexual activity: Yes    Birth control/protection: None  Other Topics Concern  . Not on file  Social History Narrative  . Not on file    Environmental and Social history  Lives in a house with a dry environment, no animals look inside the household, no carpet in the bedroom, no plastic on the bed, no plastic on the pillow, not smoking any tobacco products although he does vape.  He is a Estate agent.  Objective:   Vitals:   12/07/19 1409  BP: 140/80  Pulse: (!) 117  Resp: 18  Temp: 98.3 F (36.8 C)  SpO2: 97%   Height: 6\' 2"  (188 cm) Weight: 239 lb 3.2 oz (108.5 kg)  Physical Exam Constitutional:      Appearance: He is not diaphoretic.  HENT:     Head: Normocephalic.     Comments: Bilateral temporal fullness with nontender pulsating temporal artery    Right Ear: Tympanic membrane, ear canal and external ear normal.     Left Ear: Tympanic membrane, ear canal and external ear normal.     Nose: Nose normal. No mucosal edema or rhinorrhea.     Mouth/Throat:     Pharynx: Uvula midline. No oropharyngeal exudate.  Eyes:     Conjunctiva/sclera: Conjunctivae normal.  Neck:     Thyroid: No thyromegaly.     Trachea: Trachea normal. No tracheal tenderness or tracheal deviation.  Cardiovascular:     Rate and Rhythm: Normal rate and regular rhythm.     Heart sounds: Normal heart sounds, S1 normal and S2 normal. No murmur heard.   Pulmonary:     Effort: No respiratory distress.     Breath sounds: Normal breath sounds. No stridor. No wheezing or rales.  Lymphadenopathy:     Head:     Right side of head: No tonsillar adenopathy.     Left side of head: No tonsillar adenopathy.     Cervical: No cervical adenopathy.  Skin:    Findings: No erythema or rash.     Nails: There is no clubbing.  Neurological:     Mental Status: He is alert.     Diagnostics: Allergy skin tests were not performed.   Assessment and Plan:    1. Temporal arteritis (HCC)   2.  Essential hypertension   3. On angiotensin-converting enzyme (ACE) inhibitors     1. Blood - ANA w/R, ANCA w/R, RF, CCP, SED, CRP, C3, C4  2. Change lisinopril to losartan 50 mg - 1 tablet 1 time per day. Check BP  3. Obtain a eye exam for possible temporal arteritis   4. Visit with rheumatologist for possible temporal arteritis  5. Further evaluation?  I think with Yandel's temporal tenderness and swelling and headaches we need to exclude the possibility of temporal arteritis producing these issues and will check his blood as noted above and have him arrange for an eye  exam and have him visit with a rheumatologist in investigation of this issue.  If his sed rate comes back high we will empirically treat him with systemic steroids while we await further evaluation.  He has been on lisinopril for some time and he may be developing an unusual side effect against this medication and we will remove that ACE inhibitor and have him start losartan and check his blood pressure.  He will contact us should his blood pressure be elevated while utilizing the specific dose of losartan.  Jessica Priest, MD Allergy / Immunology [facility] [place]

## 2019-12-08 ENCOUNTER — Ambulatory Visit (HOSPITAL_COMMUNITY): Payer: BC Managed Care – PPO | Attending: Cardiovascular Disease

## 2019-12-08 ENCOUNTER — Encounter: Payer: Self-pay | Admitting: Allergy and Immunology

## 2019-12-08 ENCOUNTER — Encounter (HOSPITAL_COMMUNITY): Payer: Self-pay

## 2019-12-08 DIAGNOSIS — R011 Cardiac murmur, unspecified: Secondary | ICD-10-CM | POA: Diagnosis not present

## 2019-12-08 LAB — ECHOCARDIOGRAM COMPLETE
Area-P 1/2: 2.05 cm2
S' Lateral: 3.6 cm

## 2019-12-09 LAB — C-REACTIVE PROTEIN: CRP: <1 mg/L (ref 0–10)

## 2019-12-09 LAB — C3 AND C4
Complement C3, Serum: 164 mg/dL (ref 82–167)
Complement C4, Serum: 34 mg/dL (ref 12–38)

## 2019-12-09 LAB — RHEUMATOID FACTOR: Rheumatoid fact SerPl-aCnc: <10 IU/mL (ref 0.0–13.9)

## 2019-12-09 LAB — CYCLIC CITRUL PEPTIDE ANTIBODY, IGG/IGA: Cyclic Citrullin Peptide Ab: 6 units (ref 0–19)

## 2019-12-09 LAB — SEDIMENTATION RATE: Sed Rate: 11 mm/hr (ref 0–15)

## 2019-12-09 LAB — ANA W/REFLEX: Anti Nuclear Antibody (ANA): NEGATIVE

## 2019-12-24 ENCOUNTER — Other Ambulatory Visit: Payer: Self-pay

## 2019-12-24 ENCOUNTER — Encounter (HOSPITAL_COMMUNITY): Payer: Self-pay | Admitting: Emergency Medicine

## 2019-12-24 ENCOUNTER — Ambulatory Visit (HOSPITAL_COMMUNITY)
Admission: EM | Admit: 2019-12-24 | Discharge: 2019-12-24 | Disposition: A | Discharge status: Home / Self Care | Payer: BC Managed Care – PPO | Attending: Family Medicine | Admitting: Family Medicine

## 2019-12-24 DIAGNOSIS — F17200 Nicotine dependence, unspecified, uncomplicated: Secondary | ICD-10-CM | POA: Diagnosis not present

## 2019-12-24 DIAGNOSIS — I10 Essential (primary) hypertension: Secondary | ICD-10-CM | POA: Insufficient documentation

## 2019-12-24 DIAGNOSIS — R0789 Other chest pain: Secondary | ICD-10-CM | POA: Diagnosis not present

## 2019-12-24 DIAGNOSIS — Z79899 Other long term (current) drug therapy: Secondary | ICD-10-CM | POA: Insufficient documentation

## 2019-12-24 DIAGNOSIS — Z20822 Contact with and (suspected) exposure to covid-19: Secondary | ICD-10-CM | POA: Diagnosis not present

## 2019-12-24 DIAGNOSIS — M5416 Radiculopathy, lumbar region: Secondary | ICD-10-CM | POA: Insufficient documentation

## 2019-12-24 DIAGNOSIS — Z791 Long term (current) use of non-steroidal anti-inflammatories (NSAID): Secondary | ICD-10-CM | POA: Insufficient documentation

## 2019-12-24 DIAGNOSIS — R079 Chest pain, unspecified: Secondary | ICD-10-CM | POA: Diagnosis not present

## 2019-12-24 DIAGNOSIS — Z7952 Long term (current) use of systemic steroids: Secondary | ICD-10-CM | POA: Insufficient documentation

## 2019-12-24 LAB — SARS CORONAVIRUS 2 (TAT 6-24 HRS): SARS Coronavirus 2: NEGATIVE

## 2019-12-24 NOTE — ED Triage Notes (Signed)
Pt c/o chest pressure and tightness x 3 days. Pt states he felt nauseous earlier. Pt states pain radiates to his back and is a constant.

## 2019-12-24 NOTE — Discharge Instructions (Signed)
Your EKG did not show any concerns today.  I believe this is musculoskeletal related.  You can do anti-inflammatory pain medicine to include Advil, Aleve as needed Follow up as needed for continued or worsening symptoms

## 2019-12-25 NOTE — ED Provider Notes (Signed)
[facility]    CSN: [id number] Arrival date & time: 12/24/19  0272      History   Chief Complaint Chief Complaint  Patient presents with  . Chest Pain    HPI Jeffrey Johnson is a 34 y.o. male.   Patient is a 34 year old male presents today with chest pressure and tightness for 3 days.  Pain is located to the right side of his chest.  Symptoms are worse with movement and reproducible.  York Spaniel he has a mild nausea earlier.  Pain sometimes radiates to his back.  Is not worse by eating.  No history of GERD.  Does have low-grade fever.  No cough, chest congestion, sore throat, ear pain, shortness of breath. Aleve helps.      Past Medical History:  Diagnosis Date  . History of combined right and left heart catheterization   . Hypertension     Patient Active Problem List   Diagnosis Date Noted  . Lumbar radiculopathy 11/29/2019  . Left knee pain 11/29/2019    History reviewed. No pertinent surgical history.     Home Medications    Prior to Admission medications   Medication Sig Start Date End Date Taking? Authorizing Provider  cyclobenzaprine (FLEXERIL) 10 MG tablet Take 1 tablet (10 mg total) by mouth 2 (two) times daily as needed for muscle spasms. 10/07/19  Yes Lamptey, Britta Mccreedy, MD  gabapentin (NEURONTIN) 100 MG capsule Take 2 capsules (200 mg total) by mouth at bedtime. 11/29/19  Yes Judi Saa, DO  ibuprofen (ADVIL) 600 MG tablet Take 1 tablet (600 mg total) by mouth every 6 (six) hours as needed. 10/07/19  Yes Lamptey, Britta Mccreedy, MD  losartan (COZAAR) 50 MG tablet Take 1 tablet (50 mg total) by mouth daily. 12/07/19  Yes Kozlow, Alvira Philips, MD  Vitamin D, Ergocalciferol, (DRISDOL) 1.25 MG (50000 UNIT) CAPS capsule Take 1 capsule (50,000 Units total) by mouth every 7 (seven) days. 11/29/19  Yes Judi Saa, DO  lisinopril (ZESTRIL) 20 MG tablet Take 1 tablet (20 mg total) by mouth daily. 10/20/19   Wynn Banker, MD  predniSONE (DELTASONE) 50 MG  tablet 1 tablet by mouth daily Patient not taking: Reported on 12/07/2019 11/29/19   Judi Saa, DO    Family History Family History  Problem Relation Age of Onset  . Diabetes Other   . Diabetes Other   . Diabetes Mother   . Diabetes Father     Social History Social History   Tobacco Use  . Smoking status: Current Some Day Smoker  . Smokeless tobacco: Never Used  Vaping Use  . Vaping Use: Never used  Substance Use Topics  . Alcohol use: Yes    Comment: occasionally  . Drug use: Not Currently    Types: Marijuana     Allergies   Patient has no known allergies.   Review of Systems Review of Systems   Physical Exam Triage Vital Signs ED Triage Vitals  Enc Vitals Group     BP 12/24/19 0952 (!) 132/92     Pulse Rate 12/24/19 0952 68     Resp 12/24/19 0952 16     Temp 12/24/19 0952 99 F (37.2 C)     Temp Source 12/24/19 0952 Oral     SpO2 12/24/19 0952 98 %     Weight --      Height --      Head Circumference --      Peak Flow --  Pain Score 12/24/19 0949 7     Pain Loc --      Pain Edu? --      Excl. in GC? --    No data found.  Updated Vital Signs BP (!) 132/92 (BP Location: Left Arm)   Pulse 68   Temp 99 F (37.2 C) (Oral)   Resp 16   SpO2 98%   Visual Acuity Right Eye Distance:   Left Eye Distance:   Bilateral Distance:    Right Eye Near:   Left Eye Near:    Bilateral Near:     Physical Exam Vitals and nursing note reviewed.  Constitutional:      Appearance: Normal appearance.  HENT:     Head: Normocephalic and atraumatic.     Nose: Nose normal.  Eyes:     Conjunctiva/sclera: Conjunctivae normal.  Cardiovascular:     Rate and Rhythm: Normal rate and regular rhythm.  Pulmonary:     Effort: Pulmonary effort is normal.     Breath sounds: Normal breath sounds.  Chest:    Musculoskeletal:        General: Normal range of motion.     Cervical back: Normal range of motion.  Skin:    General: Skin is warm and dry.    Neurological:     Mental Status: He is alert.  Psychiatric:        Mood and Affect: Mood normal.      UC Treatments / Results  Labs (all labs ordered are listed, but only abnormal results are displayed) Labs Reviewed  SARS CORONAVIRUS 2 (TAT 6-24 HRS)    EKG   Radiology No results found.  Procedures Procedures (including critical care time)  Medications Ordered in UC Medications - No data to display  Initial Impression / Assessment and Plan / UC Course  I have reviewed the triage vital signs and the nursing notes.  Pertinent labs & imaging results that were available during my care of the patient were reviewed by me and considered in my medical decision making (see chart for details).     Chest pain Most likely musculoskeletal related.  EKG without any concerns today.  Compared to previous EKGs and similar with normal sinus rhythm and normal rate.  No ST elevation or concern for ACS. Recommend continue Aleve as needed Follow up as needed for continued or worsening symptoms  Final Clinical Impressions(s) / UC Diagnoses   Final diagnoses:  Chest pain, unspecified type     Discharge Instructions     Your EKG did not show any concerns today.  I believe this is musculoskeletal related.  You can do anti-inflammatory pain medicine to include Advil, Aleve as needed Follow up as needed for continued or worsening symptoms     ED Prescriptions    None     PDMP not reviewed this encounter.   Janace Aris, NP 12/25/19 1135

## 2019-12-27 ENCOUNTER — Ambulatory Visit: Payer: BC Managed Care – PPO | Admitting: Family Medicine
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
## 2020-01-04 ENCOUNTER — Telehealth: Payer: Self-pay

## 2020-01-04 NOTE — Telephone Encounter (Signed)
I received a fax from [facility] stating they have tried to contact the patient multiple times/left voicemails regarding his referral. Patient hasn't called back. I called the number on file and its out of order now.

## 2020-02-14 ENCOUNTER — Ambulatory Visit: Payer: BC Managed Care – PPO | Admitting: Family Medicine

## 2020-02-14 DIAGNOSIS — I10 Essential (primary) hypertension: Secondary | ICD-10-CM | POA: Insufficient documentation

## 2020-02-14 NOTE — Progress Notes (Deleted)
  Jeffrey Johnson DOB: [birth year] Encounter date: 02/14/2020  This is a 34 y.o. male who presents with No chief complaint on file.   History of present illness: HTN: Lisinopril switched to losartan by allergy. HPI   No Known Allergies No outpatient medications have been marked as taking for the 02/14/20 encounter (Appointment) with Wynn Banker, MD.    Review of Systems  Objective:  There were no vitals taken for this visit.      BP Readings from Last 3 Encounters:  12/24/19 (!) 132/92  12/07/19 140/80  11/29/19 110/80   Wt Readings from Last 3 Encounters:  12/07/19 239 lb 3.2 oz (108.5 kg)  11/29/19 (!) 238 lb (108 kg)  10/20/19 239 lb (108.4 kg)    Physical Exam  Assessment/Plan  There are no diagnoses linked to this encounter.       Jeffrey Shove, MD

## 2020-03-14 ENCOUNTER — Other Ambulatory Visit: Payer: Self-pay | Admitting: Family Medicine

## 2020-03-28 ENCOUNTER — Other Ambulatory Visit: Payer: Self-pay | Admitting: Family Medicine

## 2020-05-30 ENCOUNTER — Other Ambulatory Visit: Payer: Self-pay | Admitting: Allergy and Immunology

## 2020-05-31 NOTE — Telephone Encounter (Signed)
Dr. Lucie Leather are we still filling this medication. Last office visit was bach 12/2019

## 2020-05-31 NOTE — Telephone Encounter (Signed)
Please inform Jeffrey Johnson that he will need to have his primary care doctor refill this blood pressure medication. We were most interested in just removing his lisinopril rather than managing his blood pressure. Management should come from his primary care doctor.

## 2020-05-31 NOTE — Telephone Encounter (Signed)
Left a detail voicemail notifying patient that refill on Losartan will need to managed by PCP instead of our office.

## 2020-10-19 ENCOUNTER — Other Ambulatory Visit: Payer: Self-pay | Admitting: Allergy and Immunology

## 2020-11-30 ENCOUNTER — Other Ambulatory Visit: Payer: Self-pay | Admitting: Family Medicine

## 2021-01-12 ENCOUNTER — Other Ambulatory Visit: Payer: Self-pay | Admitting: Allergy and Immunology

## 2021-01-19 ENCOUNTER — Other Ambulatory Visit: Payer: Self-pay | Admitting: Family Medicine

## 2021-03-06 IMAGING — MG DIGITAL DIAGNOSTIC BILAT W/ TOMO W/ CAD
6 of 10 series · 6 of 30 positions shown · non-contrast
Comparison: Previous exam(s).

ACR Breast Density Category a: The breast tissue is almost entirely
fatty.

CLINICAL DATA: Persistent palpable lump in the left breast.

EXAM:
DIGITAL DIAGNOSTIC BILATERAL MAMMOGRAM WITH CAD AND TOMO
ULTRASOUND LEFT BREAST

[L MLO synth-2D]
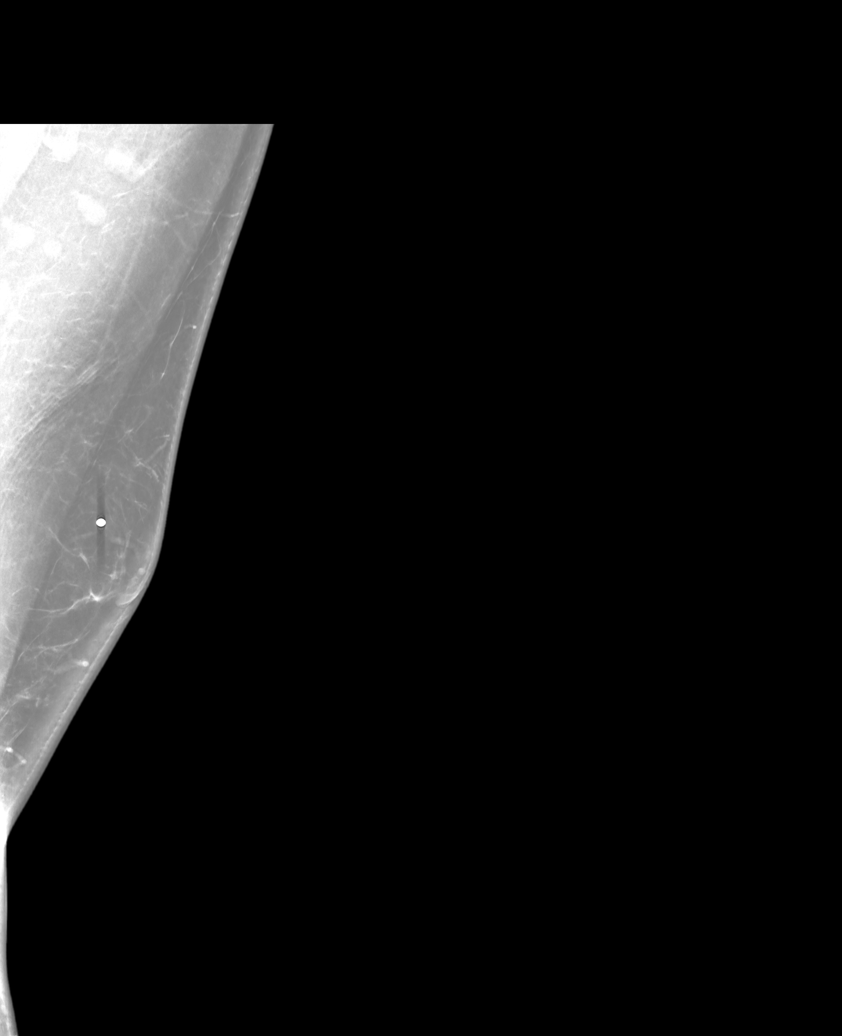

[L TAN synth-2D]
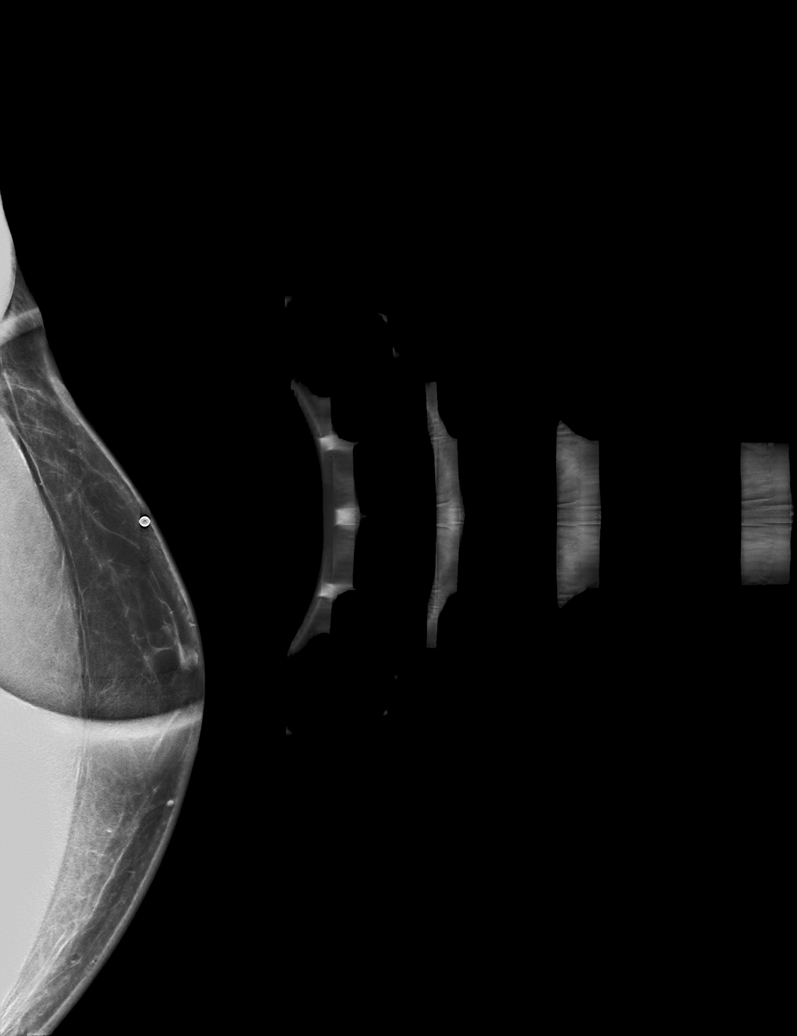

[R MLO synth-2D]
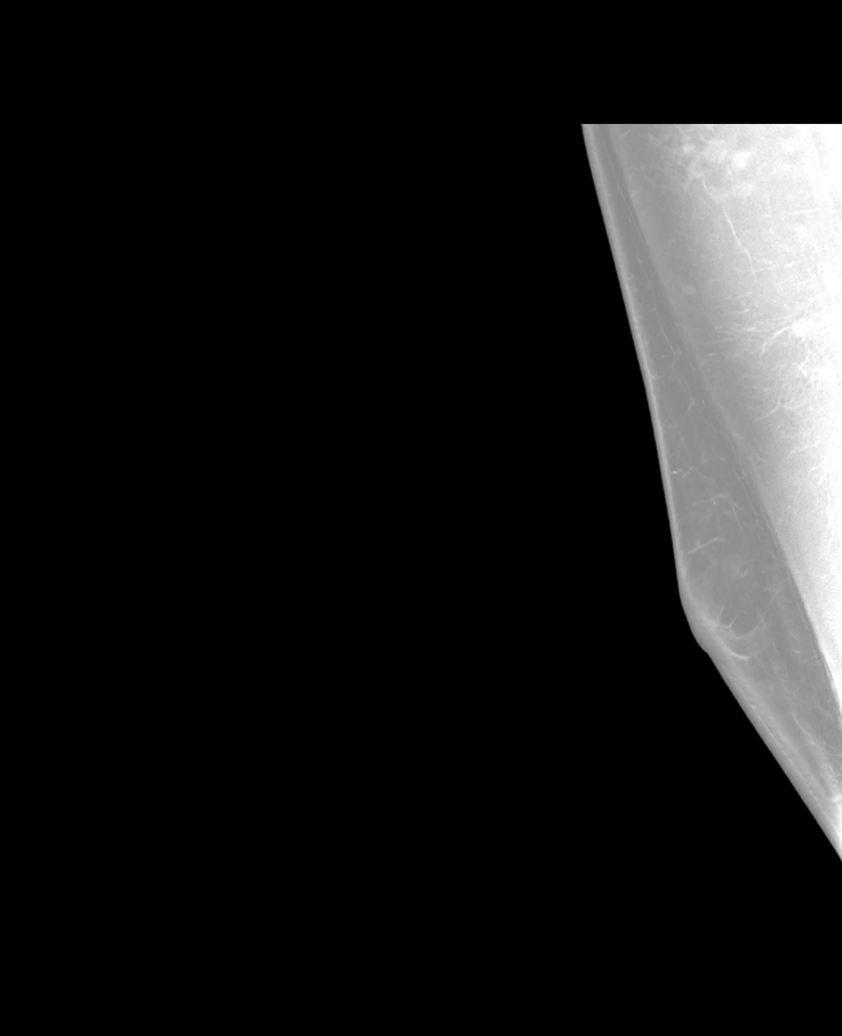

[R CC synth-2D]
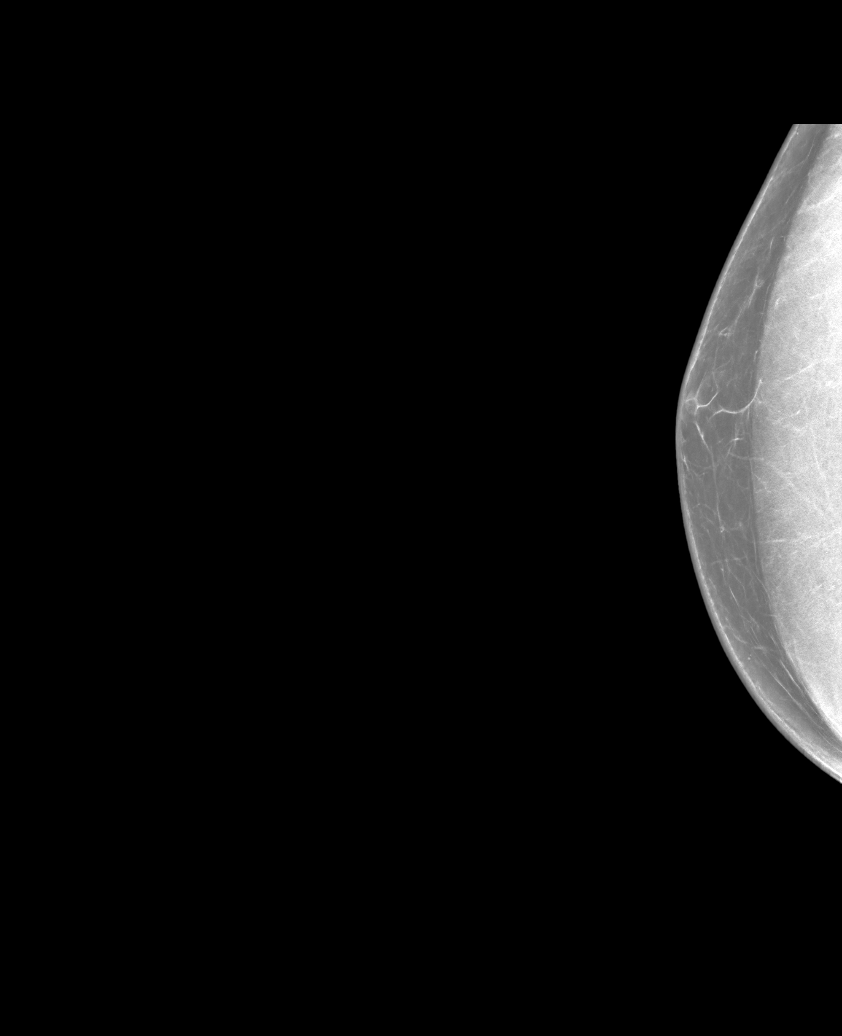

[L CC synth-2D]
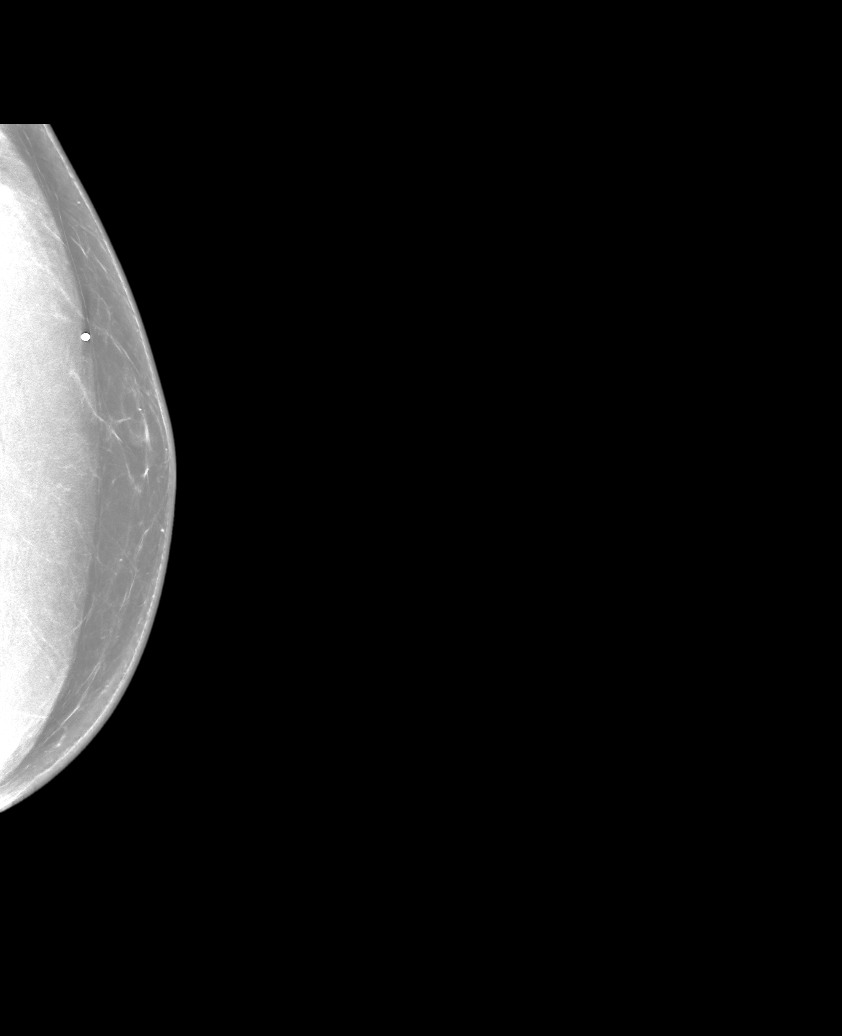

[R MLO tomo · tomo slice 55/108.0]
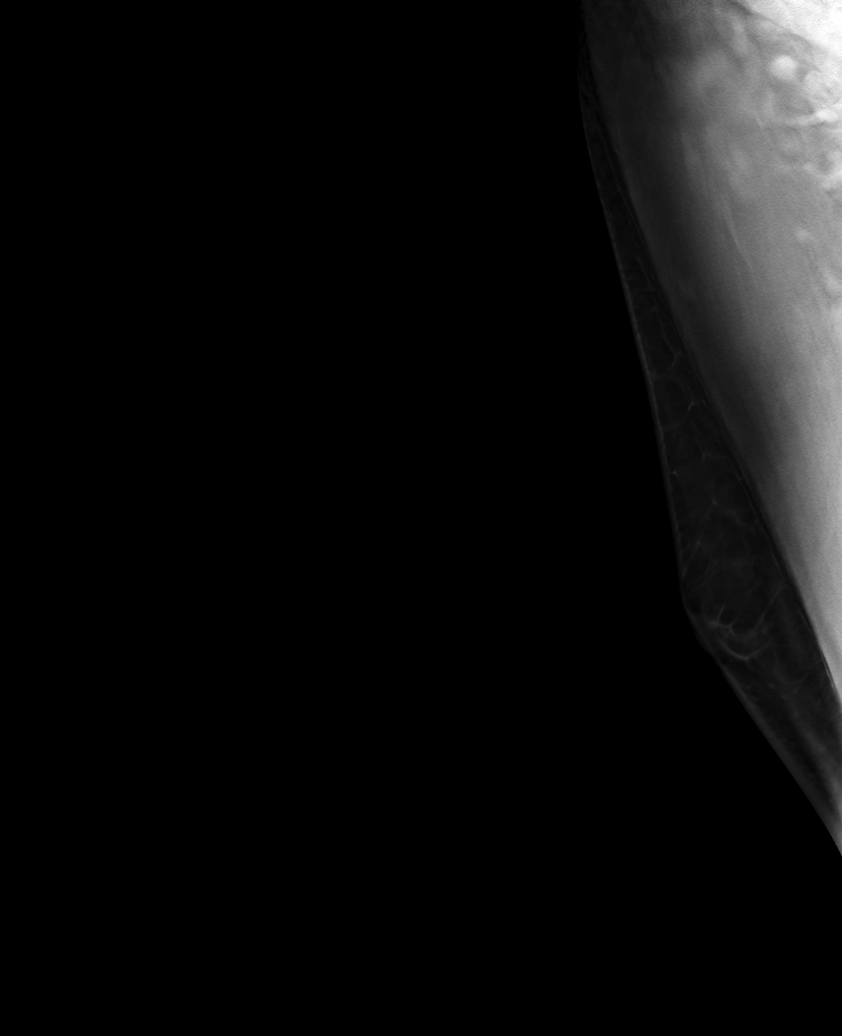

[6 of 30 positions shown; findings below may reference images not displayed]

FINDINGS: No suspicious masses, calcifications, distortion, or gynecomastia.

Mammographic images were processed with CAD.

On physical exam, no suspicious lumps are identified.

Targeted ultrasound is performed, showing no sonographic
abnormalities.
IMPRESSION: No mammographic or sonographic abnormalities. No evidence of
malignancy.

RECOMMENDATION:
Treatment of the patient's symptoms should be based on clinical and
physical exam given lack of imaging findings. No imaging follow-up
is necessary.

I have discussed the findings and recommendations with the patient.
If applicable, a reminder letter will be sent to the patient
regarding the next appointment.

BI-RADS CATEGORY  1: Negative.

## 2021-05-26 IMAGING — DX DG KNEE COMPLETE 4+V*L*
4 series · 4 of 4 positions shown · non-contrast
Comparison: None.

CLINICAL DATA: Left-sided knee pain

EXAM:
LEFT KNEE - COMPLETE 4+ VIEW

[knee ap]
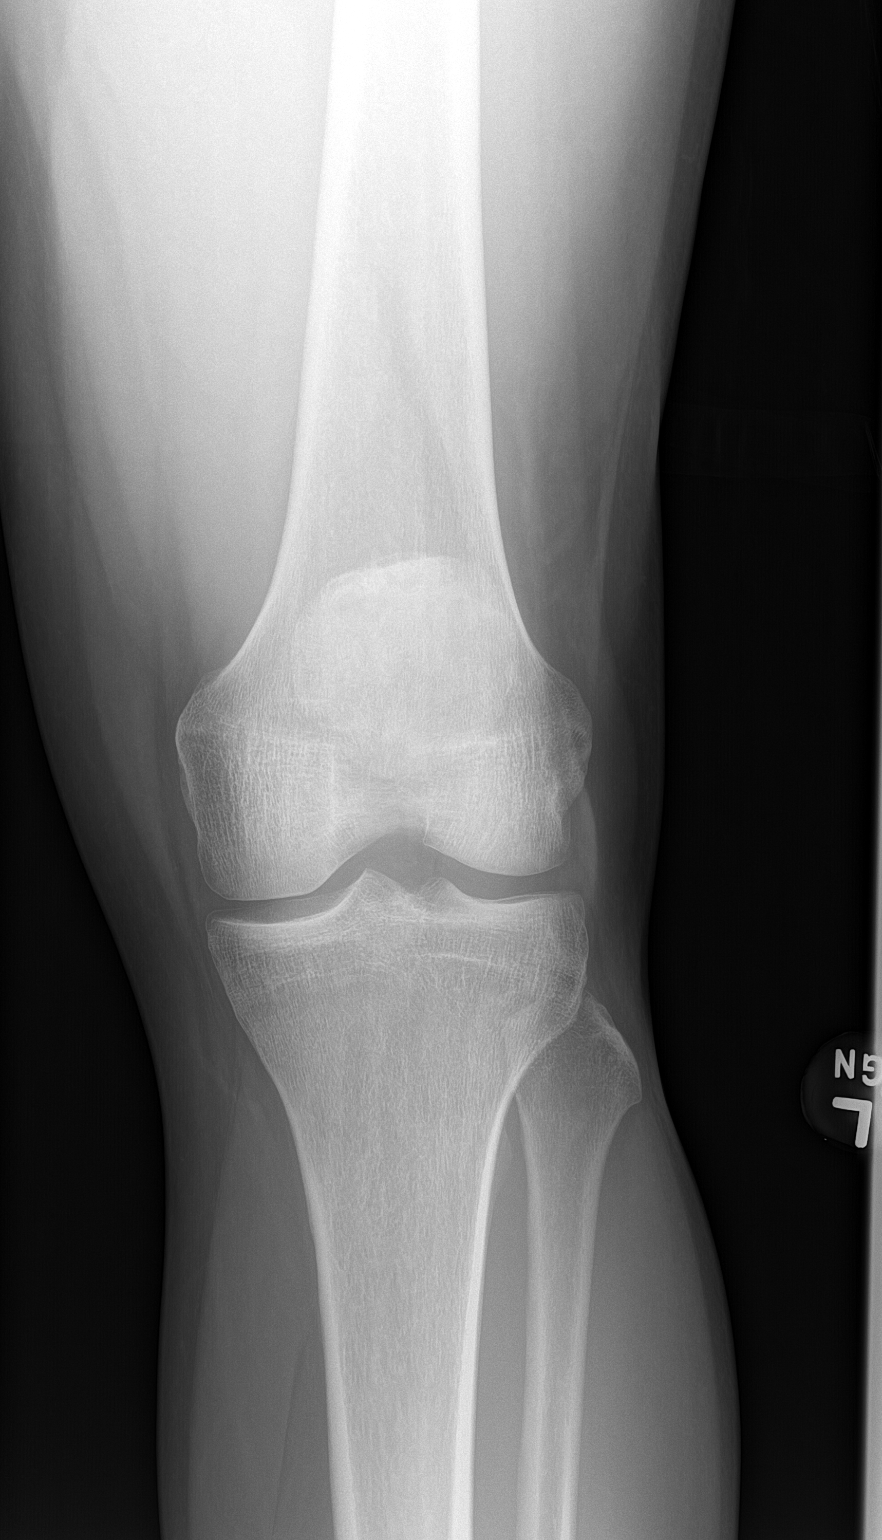

[knee obl (1 of 2)]
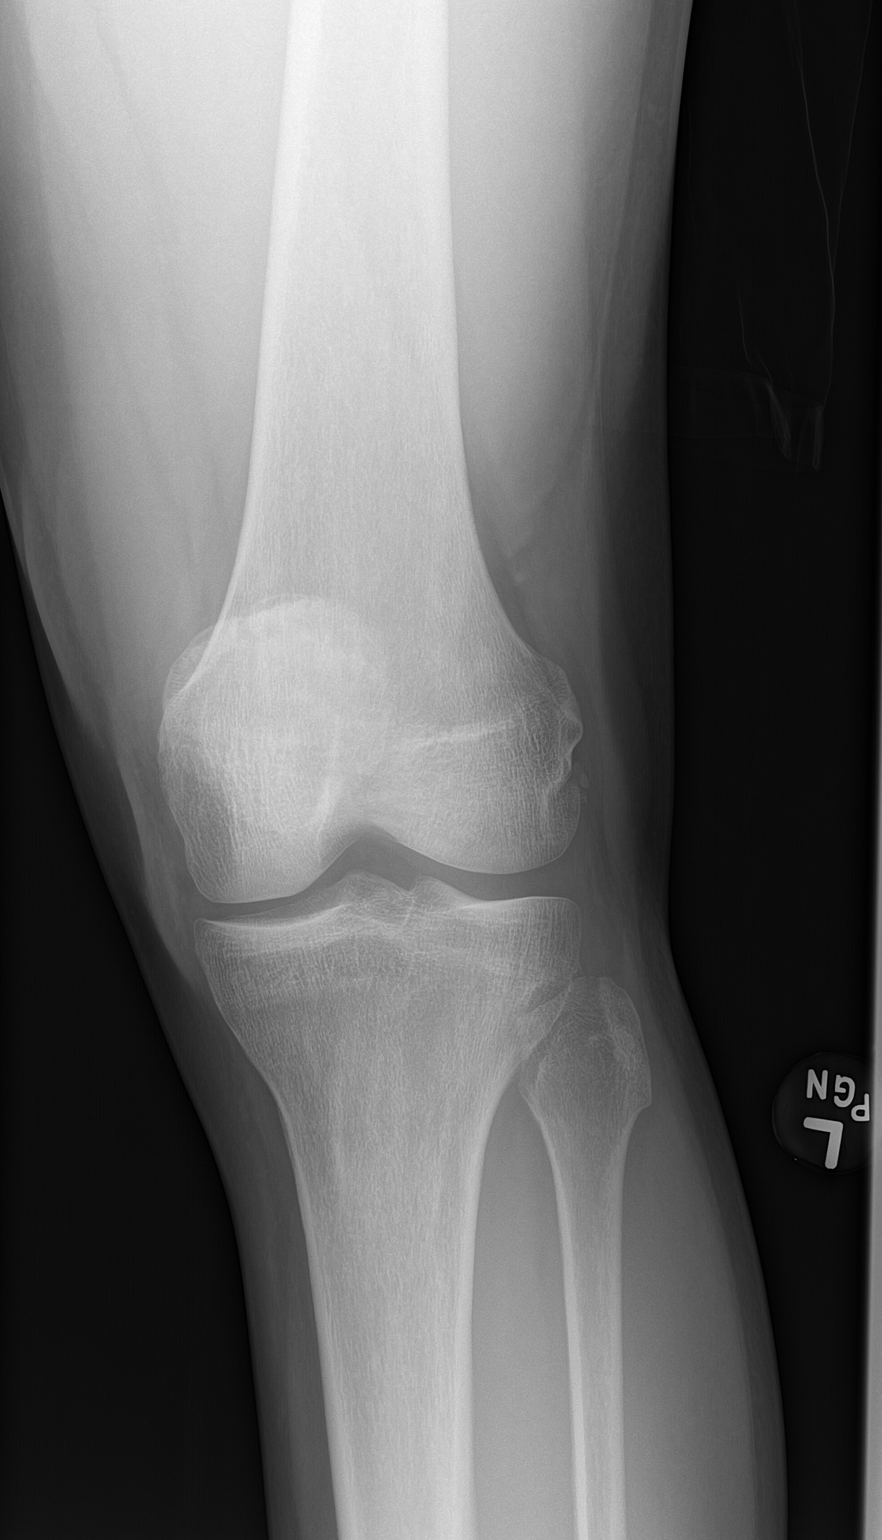

[knee obl (2 of 2)]
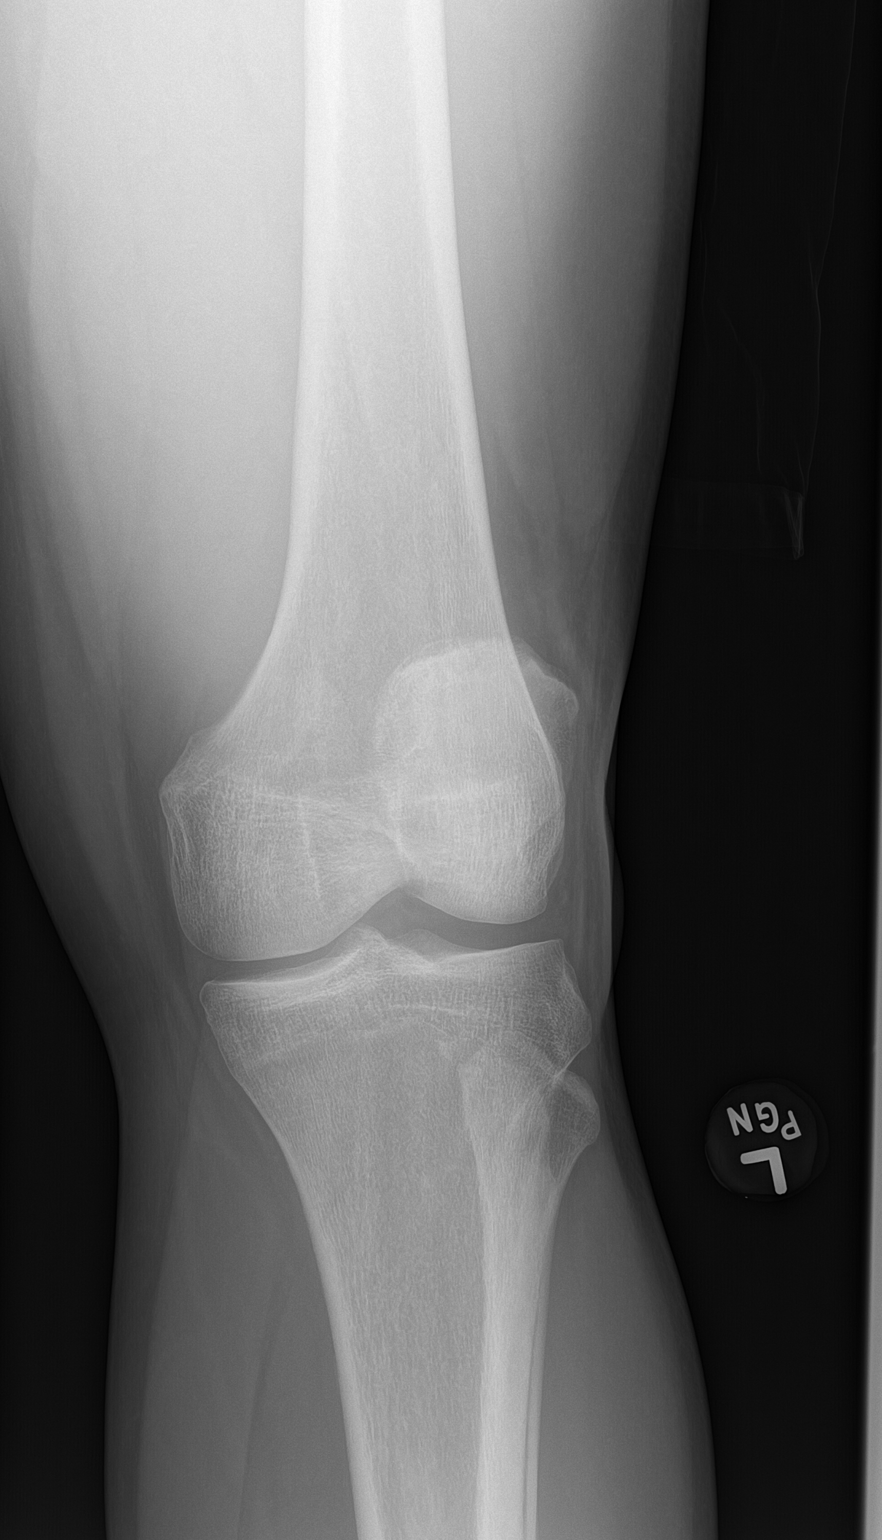

[knee lat]
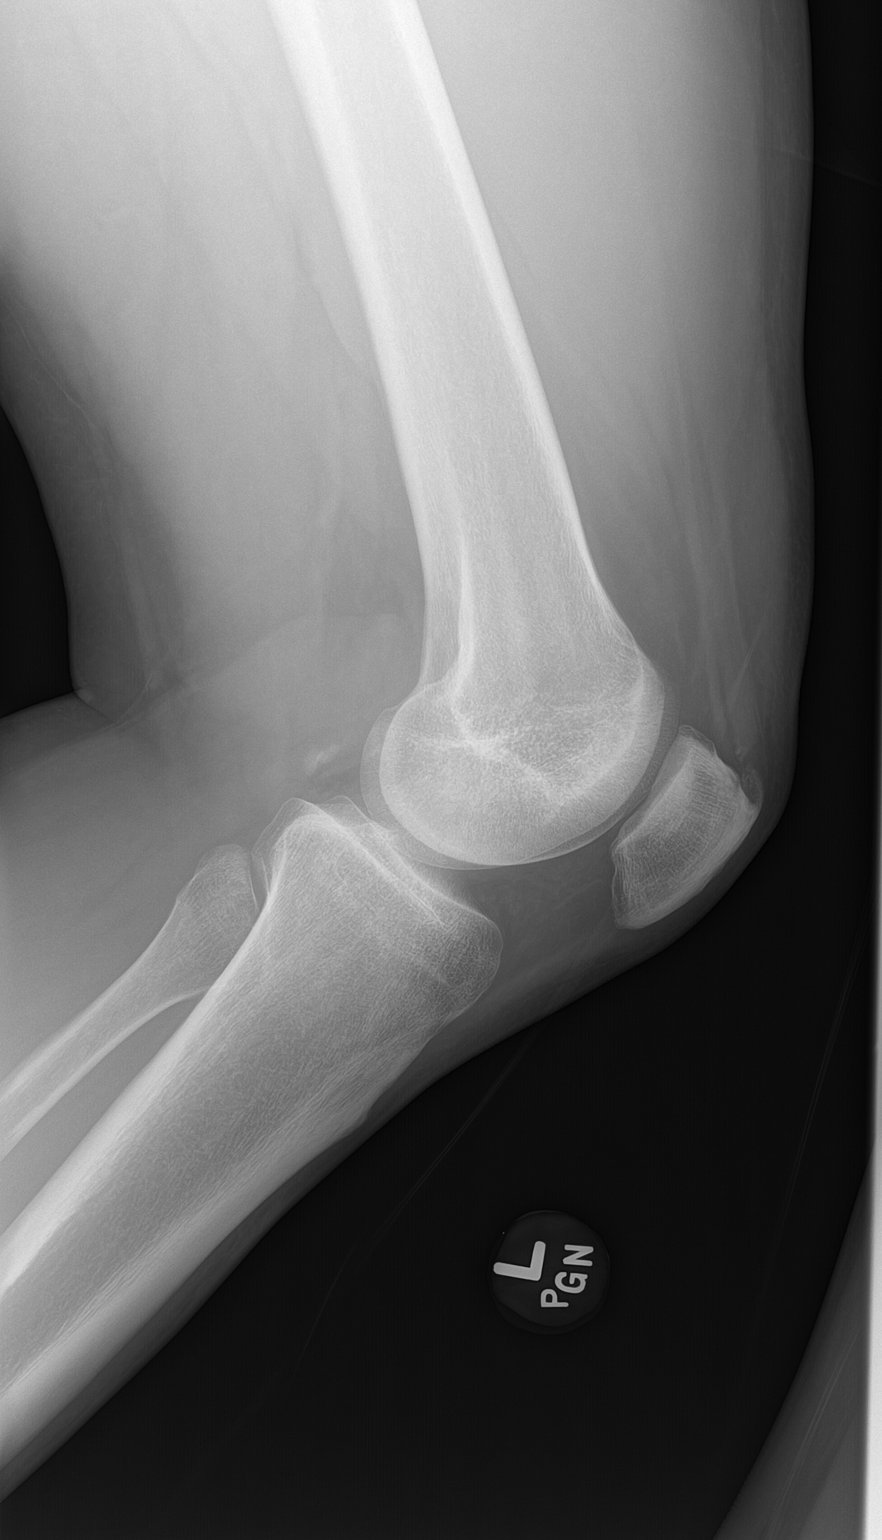

[4 of 4 positions shown; findings below may reference images not displayed]

FINDINGS: No evidence of fracture, dislocation, or joint effusion. No evidence
of arthropathy or other focal bone abnormality. Soft tissues are
unremarkable.
IMPRESSION: Negative.
